# Patient Record
Sex: Male | Born: 1950
Health system: Southern US, Community
[De-identification: ages and names within clinical notes are randomized; demographics above are authoritative.]

## PROBLEM LIST (undated history)

## (undated) DIAGNOSIS — C801 Malignant (primary) neoplasm, unspecified: Secondary | ICD-10-CM

## (undated) DIAGNOSIS — S42009A Fracture of unspecified part of unspecified clavicle, initial encounter for closed fracture: Secondary | ICD-10-CM

## (undated) DIAGNOSIS — K219 Gastro-esophageal reflux disease without esophagitis: Secondary | ICD-10-CM

## (undated) DIAGNOSIS — T4145XA Adverse effect of unspecified anesthetic, initial encounter: Secondary | ICD-10-CM

## (undated) DIAGNOSIS — R011 Cardiac murmur, unspecified: Secondary | ICD-10-CM

## (undated) DIAGNOSIS — Z86718 Personal history of other venous thrombosis and embolism: Secondary | ICD-10-CM

## (undated) DIAGNOSIS — D649 Anemia, unspecified: Secondary | ICD-10-CM

## (undated) DIAGNOSIS — R413 Other amnesia: Secondary | ICD-10-CM

## (undated) DIAGNOSIS — G2581 Restless legs syndrome: Secondary | ICD-10-CM

## (undated) DIAGNOSIS — R7303 Prediabetes: Secondary | ICD-10-CM

## (undated) DIAGNOSIS — N400 Enlarged prostate without lower urinary tract symptoms: Secondary | ICD-10-CM

## (undated) DIAGNOSIS — T8859XA Other complications of anesthesia, initial encounter: Secondary | ICD-10-CM

## (undated) DIAGNOSIS — H9190 Unspecified hearing loss, unspecified ear: Secondary | ICD-10-CM

## (undated) DIAGNOSIS — Z87828 Personal history of other (healed) physical injury and trauma: Secondary | ICD-10-CM

## (undated) DIAGNOSIS — I10 Essential (primary) hypertension: Secondary | ICD-10-CM

## (undated) DIAGNOSIS — M1990 Unspecified osteoarthritis, unspecified site: Secondary | ICD-10-CM

## (undated) HISTORY — PX: COLONOSCOPY: SHX174

## (undated) HISTORY — PX: TONSILLECTOMY: SUR1361

## (undated) HISTORY — PX: OTHER SURGICAL HISTORY: SHX169

## (undated) HISTORY — PX: PROSTATE BIOPSY: SHX241

---

## 1986-06-18 DIAGNOSIS — Z87828 Personal history of other (healed) physical injury and trauma: Secondary | ICD-10-CM

## 1986-06-18 DIAGNOSIS — S42009A Fracture of unspecified part of unspecified clavicle, initial encounter for closed fracture: Secondary | ICD-10-CM

## 1986-06-18 HISTORY — DX: Fracture of unspecified part of unspecified clavicle, initial encounter for closed fracture: S42.009A

## 1986-06-18 HISTORY — DX: Personal history of other (healed) physical injury and trauma: Z87.828

## 2002-10-03 ENCOUNTER — Ambulatory Visit (HOSPITAL_COMMUNITY): Admission: RE | Admit: 2002-10-03 | Discharge: 2002-10-03 | Payer: Self-pay | Admitting: Gastroenterology

## 2013-10-26 ENCOUNTER — Other Ambulatory Visit (HOSPITAL_COMMUNITY): Payer: Self-pay | Admitting: Orthopaedic Surgery

## 2013-11-08 ENCOUNTER — Encounter (HOSPITAL_COMMUNITY): Payer: Self-pay | Admitting: Pharmacy Technician

## 2013-11-14 ENCOUNTER — Encounter (HOSPITAL_COMMUNITY)
Admission: RE | Admit: 2013-11-14 | Discharge: 2013-11-14 | Disposition: A | Discharge status: Home / Self Care | Payer: Managed Care, Other (non HMO) | Source: Ambulatory Visit | Attending: Orthopaedic Surgery | Admitting: Orthopaedic Surgery

## 2013-11-14 ENCOUNTER — Encounter (HOSPITAL_COMMUNITY): Payer: Self-pay

## 2013-11-14 ENCOUNTER — Encounter (INDEPENDENT_AMBULATORY_CARE_PROVIDER_SITE_OTHER): Payer: Self-pay

## 2013-11-14 DIAGNOSIS — Z01818 Encounter for other preprocedural examination: Secondary | ICD-10-CM | POA: Insufficient documentation

## 2013-11-14 DIAGNOSIS — Z01812 Encounter for preprocedural laboratory examination: Secondary | ICD-10-CM | POA: Insufficient documentation

## 2013-11-14 HISTORY — DX: Benign prostatic hyperplasia without lower urinary tract symptoms: N40.0

## 2013-11-14 HISTORY — DX: Cardiac murmur, unspecified: R01.1

## 2013-11-14 HISTORY — DX: Unspecified osteoarthritis, unspecified site: M19.90

## 2013-11-14 HISTORY — DX: Gastro-esophageal reflux disease without esophagitis: K21.9

## 2013-11-14 LAB — URINALYSIS, ROUTINE W REFLEX MICROSCOPIC
Bilirubin Urine: NEGATIVE
Glucose, UA: NEGATIVE mg/dL
Hgb urine dipstick: NEGATIVE
Ketones, ur: NEGATIVE mg/dL
Leukocytes, UA: NEGATIVE
Nitrite: NEGATIVE
PH: 5.5 (ref 5.0–8.0)
Protein, ur: NEGATIVE mg/dL
SPECIFIC GRAVITY, URINE: 1.02 (ref 1.005–1.030)
Urobilinogen, UA: 0.2 mg/dL (ref 0.0–1.0)

## 2013-11-14 LAB — BASIC METABOLIC PANEL
ANION GAP: 11 (ref 5–15)
BUN: 17 mg/dL (ref 6–23)
CO2: 27 mEq/L (ref 19–32)
Calcium: 9 mg/dL (ref 8.4–10.5)
Chloride: 102 mEq/L (ref 96–112)
Creatinine, Ser: 0.82 mg/dL (ref 0.50–1.35)
Glucose, Bld: 130 mg/dL — ABNORMAL HIGH (ref 70–99)
POTASSIUM: 3.5 meq/L — AB (ref 3.7–5.3)
SODIUM: 140 meq/L (ref 137–147)

## 2013-11-14 LAB — SURGICAL PCR SCREEN
MRSA, PCR: NEGATIVE
Staphylococcus aureus: NEGATIVE

## 2013-11-14 LAB — APTT: APTT: 30 s (ref 24–37)

## 2013-11-14 LAB — CBC
HCT: 38.9 % — ABNORMAL LOW (ref 39.0–52.0)
Hemoglobin: 13 g/dL (ref 13.0–17.0)
MCH: 27.3 pg (ref 26.0–34.0)
MCHC: 33.4 g/dL (ref 30.0–36.0)
MCV: 81.7 fL (ref 78.0–100.0)
PLATELETS: 275 10*3/uL (ref 150–400)
RBC: 4.76 MIL/uL (ref 4.22–5.81)
RDW: 15.8 % — ABNORMAL HIGH (ref 11.5–15.5)
WBC: 4.5 10*3/uL (ref 4.0–10.5)

## 2013-11-14 LAB — PROTIME-INR
INR: 0.98 (ref 0.00–1.49)
PROTHROMBIN TIME: 13 s (ref 11.6–15.2)

## 2013-11-14 NOTE — Pre-Procedure Instructions (Signed)
EKG AND CXR WERE NOT DONE PREOP - NOT NEEDED PER ANESTHESIOLOGIST'S GUIDELINES.

## 2013-11-14 NOTE — Patient Instructions (Signed)
YOUR SURGERY IS SCHEDULED AT [facility]  ON:  Friday  8/7  REPORT TO  SHORT STAY CENTER AT:  8:15 AM   PLEASE COME IN THE [place] ENTRANCE AND FOLLOW SIGNS TO SHORT STAY CENTER.  DO NOT EAT OR DRINK ANYTHING AFTER MIDNIGHT THE NIGHT BEFORE YOUR SURGERY.  YOU MAY BRUSH YOUR TEETH, RINSE OUT YOUR MOUTH--BUT NO WATER, NO FOOD, NO CHEWING GUM, NO MINTS, NO CANDIES, NO CHEWING TOBACCO.  PLEASE TAKE THE FOLLOWING MEDICATIONS THE AM OF YOUR SURGERY WITH A FEW SIPS OF WATER:  PROZAC  I DO NOT BRING VALUABLES, MONEY, CREDIT CARDS.  DO NOT WEAR JEWELRY, MAKE-UP, NAIL POLISH AND NO METAL PINS OR CLIPS IN YOUR HAIR. CONTACT LENS, DENTURES / PARTIALS, GLASSES SHOULD NOT BE WORN TO SURGERY AND IN MOST CASES-HEARING AIDS WILL NEED TO BE REMOVED.  BRING YOUR GLASSES CASE, ANY EQUIPMENT NEEDED FOR YOUR CONTACT LENS. FOR PATIENTS ADMITTED TO THE HOSPITAL--CHECK OUT TIME THE DAY OF DISCHARGE IS 11:00 AM.  ALL INPATIENT ROOMS ARE PRIVATE - WITH BATHROOM, TELEPHONE, TELEVISION AND WIFI INTERNET.                                                    PLEASE READ OVER ANY  FACT SHEETS THAT YOU WERE GIVEN: MRSA INFORMATION, BLOOD TRANSFUSION INFORMATION, INCENTIVE SPIROMETER INFORMATION.  PLEASE BE AWARE THAT YOU MAY NEED ADDITIONAL BLOOD DRAWN DAY OF YOUR SURGERY  _______________________________________________________________________   [facility] - Preparing for Surgery Before surgery, you can play an important role.  Because skin is not sterile, your skin needs to be as free of germs as possible.  You can reduce the number of germs on your skin by washing with CHG (chlorahexidine gluconate) soap before surgery.  CHG is an antiseptic cleaner which kills germs and bonds with the skin to continue killing germs even after washing. Please DO NOT use if you have an allergy to CHG or antibacterial soaps.  If your skin becomes reddened/irritated stop using the CHG and inform your nurse when  you arrive at Short Stay. Do not shave (including legs and underarms) for at least 48 hours prior to the first CHG shower.  You may shave your face/neck. Please follow these instructions carefully:  1.  Shower with CHG Soap the night before surgery and the  morning of Surgery.  2.  If you choose to wash your hair, wash your hair first as usual with your  normal  shampoo.  3.  After you shampoo, rinse your hair and body thoroughly to remove the  shampoo.                           4.  Use CHG as you would any other liquid soap.  You can apply chg directly  to the skin and wash                       Gently with a scrungie or clean washcloth.  5.  Apply the CHG Soap to your body ONLY FROM THE NECK DOWN.   Do not use on face/ open                           Wound or open  sores. Avoid contact with eyes, ears mouth and genitals (private parts).                       Wash face,  Genitals (private parts) with your normal soap.             6.  Wash thoroughly, paying special attention to the area where your surgery  will be performed.  7.  Thoroughly rinse your body with warm water from the neck down.  8.  DO NOT shower/wash with your normal soap after using and rinsing off  the CHG Soap.                9.  Pat yourself dry with a clean towel.            10.  Wear clean pajamas.            11.  Place clean sheets on your bed the night of your first shower and do not  sleep with pets. Day of Surgery : Do not apply any lotions/deodorants the morning of surgery.  Please wear clean clothes to the hospital/surgery center.  FAILURE TO FOLLOW THESE INSTRUCTIONS MAY RESULT IN THE CANCELLATION OF YOUR SURGERY PATIENT SIGNATURE_________________________________  NURSE SIGNATURE__________________________________  ________________________________________________________________________  WHAT IS A BLOOD TRANSFUSION? Blood Transfusion Information  A transfusion is the replacement of blood or some of its parts.  Blood is made up of multiple cells which provide different functions.  Red blood cells carry oxygen and are used for blood loss replacement.  White blood cells fight against infection.  Platelets control bleeding.  Plasma helps clot blood.  Other blood products are available for specialized needs, such as hemophilia or other clotting disorders. BEFORE THE TRANSFUSION  Who gives blood for transfusions?   Healthy volunteers who are fully evaluated to make sure their blood is safe. This is blood bank blood. Transfusion therapy is the safest it has ever been in the practice of medicine. Before blood is taken from a donor, a complete history is taken to make sure that person has no history of diseases nor engages in risky social behavior (examples are intravenous drug use or sexual activity with multiple partners). The donor's travel history is screened to minimize risk of transmitting infections, such as malaria. The donated blood is tested for signs of infectious diseases, such as HIV and hepatitis. The blood is then tested to be sure it is compatible with you in order to minimize the chance of a transfusion reaction. If you or a relative donates blood, this is often done in anticipation of surgery and is not appropriate for emergency situations. It takes many days to process the donated blood. RISKS AND COMPLICATIONS Although transfusion therapy is very safe and saves many lives, the main dangers of transfusion include:   Getting an infectious disease.  Developing a transfusion reaction. This is an allergic reaction to something in the blood you were given. Every precaution is taken to prevent this. The decision to have a blood transfusion has been considered carefully by your caregiver before blood is given. Blood is not given unless the benefits outweigh the risks. AFTER THE TRANSFUSION  Right after receiving a blood transfusion, you will usually feel much better and more energetic. This is  especially true if your red blood cells have gotten low (anemic). The transfusion raises the level of the red blood cells which carry oxygen, and this usually causes an energy increase.  The nurse administering  the transfusion will monitor you carefully for complications. HOME CARE INSTRUCTIONS  No special instructions are needed after a transfusion. You may find your energy is better. Speak with your caregiver about any limitations on activity for underlying diseases you may have. SEEK MEDICAL CARE IF:   Your condition is not improving after your transfusion.  You develop redness or irritation at the intravenous (IV) site. SEEK IMMEDIATE MEDICAL CARE IF:  Any of the following symptoms occur over the next 12 hours:  Shaking chills.  You have a temperature by mouth above 102 F (38.9 C), not controlled by medicine.  Chest, back, or muscle pain.  People around you feel you are not acting correctly or are confused.  Shortness of breath or difficulty breathing.  Dizziness and fainting.  You get a rash or develop hives.  You have a decrease in urine output.  Your urine turns a dark color or changes to pink, red, or brown. Any of the following symptoms occur over the next 10 days:  You have a temperature by mouth above 102 F (38.9 C), not controlled by medicine.  Shortness of breath.  Weakness after normal activity.  The white part of the eye turns yellow (jaundice).  You have a decrease in the amount of urine or are urinating less often.  Your urine turns a dark color or changes to pink, red, or brown. Document Released: 04/02/2000 Document Revised: 06/28/2011 Document Reviewed: 11/20/2007 [facility] Patient Information 2014 [place], [place].  _______________________________________________________________________

## 2013-11-23 ENCOUNTER — Inpatient Hospital Stay (HOSPITAL_COMMUNITY): Payer: Managed Care, Other (non HMO)

## 2013-11-23 ENCOUNTER — Inpatient Hospital Stay (HOSPITAL_COMMUNITY)
Admission: RE | Admit: 2013-11-23 | Discharge: 2013-11-27 | DRG: 462 | Disposition: A | Discharge status: Rehabilitation Facility | Payer: Managed Care, Other (non HMO) | Source: Ambulatory Visit | Attending: Orthopaedic Surgery | Admitting: Orthopaedic Surgery

## 2013-11-23 ENCOUNTER — Encounter (HOSPITAL_COMMUNITY): Payer: Self-pay | Admitting: *Deleted

## 2013-11-23 ENCOUNTER — Encounter (HOSPITAL_COMMUNITY)
Admission: RE | Disposition: A | Discharge status: Rehabilitation Facility | Payer: Self-pay | Source: Ambulatory Visit | Attending: Orthopaedic Surgery

## 2013-11-23 ENCOUNTER — Encounter (HOSPITAL_COMMUNITY): Payer: Managed Care, Other (non HMO) | Admitting: Anesthesiology

## 2013-11-23 ENCOUNTER — Inpatient Hospital Stay (HOSPITAL_COMMUNITY): Payer: Managed Care, Other (non HMO) | Admitting: Anesthesiology

## 2013-11-23 DIAGNOSIS — R030 Elevated blood-pressure reading, without diagnosis of hypertension: Secondary | ICD-10-CM | POA: Diagnosis present

## 2013-11-23 DIAGNOSIS — M25469 Effusion, unspecified knee: Secondary | ICD-10-CM | POA: Diagnosis present

## 2013-11-23 DIAGNOSIS — M170 Bilateral primary osteoarthritis of knee: Secondary | ICD-10-CM

## 2013-11-23 DIAGNOSIS — D62 Acute posthemorrhagic anemia: Secondary | ICD-10-CM | POA: Diagnosis not present

## 2013-11-23 DIAGNOSIS — M898X9 Other specified disorders of bone, unspecified site: Secondary | ICD-10-CM | POA: Diagnosis present

## 2013-11-23 DIAGNOSIS — Z87891 Personal history of nicotine dependence: Secondary | ICD-10-CM

## 2013-11-23 DIAGNOSIS — K219 Gastro-esophageal reflux disease without esophagitis: Secondary | ICD-10-CM | POA: Diagnosis present

## 2013-11-23 DIAGNOSIS — M1710 Unilateral primary osteoarthritis, unspecified knee: Principal | ICD-10-CM | POA: Diagnosis present

## 2013-11-23 DIAGNOSIS — N400 Enlarged prostate without lower urinary tract symptoms: Secondary | ICD-10-CM | POA: Diagnosis present

## 2013-11-23 DIAGNOSIS — Z96653 Presence of artificial knee joint, bilateral: Secondary | ICD-10-CM

## 2013-11-23 DIAGNOSIS — E119 Type 2 diabetes mellitus without complications: Secondary | ICD-10-CM | POA: Diagnosis present

## 2013-11-23 DIAGNOSIS — M21869 Other specified acquired deformities of unspecified lower leg: Secondary | ICD-10-CM | POA: Diagnosis present

## 2013-11-23 HISTORY — PX: TOTAL KNEE ARTHROPLASTY: SHX125

## 2013-11-23 LAB — TYPE AND SCREEN
ABO/RH(D): O POS
Antibody Screen: NEGATIVE

## 2013-11-23 LAB — ABO/RH: ABO/RH(D): O POS

## 2013-11-23 SURGERY — ARTHROPLASTY, KNEE, BILATERAL, TOTAL
Anesthesia: Epidural | Site: Knee | Laterality: Bilateral

## 2013-11-23 MED ORDER — HYDROMORPHONE HCL PF 1 MG/ML IJ SOLN
1.0000 mg | INTRAMUSCULAR | Status: DC | PRN
  Administered 2013-11-24: 1 mg via INTRAVENOUS
  Filled 2013-11-23: qty 1

## 2013-11-23 MED ORDER — DOCUSATE SODIUM 100 MG PO CAPS
100.0000 mg | ORAL_CAPSULE | Freq: Two times a day (BID) | ORAL | Status: DC
  Administered 2013-11-23 – 2013-11-27 (×8): 100 mg via ORAL

## 2013-11-23 MED ORDER — PROPOFOL 10 MG/ML IV BOLUS
INTRAVENOUS | Status: AC
  Filled 2013-11-23: qty 20

## 2013-11-23 MED ORDER — BUPIVACAINE HCL (PF) 0.5 % IJ SOLN
INTRAMUSCULAR | Status: DC | PRN
  Administered 2013-11-23: 4 mL via EPIDURAL
  Administered 2013-11-23 (×2): 5 mL via EPIDURAL

## 2013-11-23 MED ORDER — POLYETHYLENE GLYCOL 3350 17 G PO PACK
17.0000 g | PACK | Freq: Every day | ORAL | Status: DC | PRN
  Administered 2013-11-24 – 2013-11-26 (×3): 17 g via ORAL

## 2013-11-23 MED ORDER — ONDANSETRON HCL 4 MG PO TABS: 4.0000 mg | ORAL_TABLET | Freq: Four times a day (QID) | ORAL | Status: DC | PRN

## 2013-11-23 MED ORDER — ALUM & MAG HYDROXIDE-SIMETH 200-200-20 MG/5ML PO SUSP
30.0000 mL | ORAL | Status: DC | PRN
  Administered 2013-11-27: 30 mL via ORAL
  Filled 2013-11-23: qty 30

## 2013-11-23 MED ORDER — LIDOCAINE-EPINEPHRINE 2 %-1:100000 IJ SOLN
INTRAMUSCULAR | Status: DC | PRN
  Administered 2013-11-23: 5 mL
  Administered 2013-11-23: 4 mL via INTRADERMAL
  Administered 2013-11-23: 6 mL via INTRADERMAL
  Administered 2013-11-23: 5 mL via INTRADERMAL

## 2013-11-23 MED ORDER — OXYCODONE HCL 5 MG PO TABS
5.0000 mg | ORAL_TABLET | ORAL | Status: DC | PRN
  Administered 2013-11-23 – 2013-11-24 (×2): 10 mg via ORAL
  Administered 2013-11-24 – 2013-11-25 (×5): 15 mg via ORAL
  Administered 2013-11-25: 10 mg via ORAL
  Administered 2013-11-25 (×2): 15 mg via ORAL
  Administered 2013-11-25 – 2013-11-26 (×6): 10 mg via ORAL
  Administered 2013-11-27: 5 mg via ORAL
  Filled 2013-11-23: qty 3
  Filled 2013-11-23: qty 2
  Filled 2013-11-23 (×2): qty 3
  Filled 2013-11-23 (×2): qty 2
  Filled 2013-11-23: qty 3
  Filled 2013-11-23 (×2): qty 2
  Filled 2013-11-23: qty 3
  Filled 2013-11-23 (×2): qty 2
  Filled 2013-11-23: qty 1
  Filled 2013-11-23: qty 3
  Filled 2013-11-23 (×2): qty 2
  Filled 2013-11-23: qty 3
  Filled 2013-11-23 (×2): qty 1

## 2013-11-23 MED ORDER — FENTANYL CITRATE 0.05 MG/ML IJ SOLN
INTRAMUSCULAR | Status: AC
  Filled 2013-11-23: qty 2

## 2013-11-23 MED ORDER — ONDANSETRON HCL 4 MG/2ML IJ SOLN: 4.0000 mg | Freq: Four times a day (QID) | INTRAMUSCULAR | Status: DC | PRN

## 2013-11-23 MED ORDER — DIPHENHYDRAMINE HCL 12.5 MG/5ML PO ELIX: 12.5000 mg | ORAL_SOLUTION | ORAL | Status: DC | PRN

## 2013-11-23 MED ORDER — ACETAMINOPHEN 650 MG RE SUPP: 650.0000 mg | Freq: Four times a day (QID) | RECTAL | Status: DC | PRN

## 2013-11-23 MED ORDER — METOCLOPRAMIDE HCL 10 MG PO TABS: 5.0000 mg | ORAL_TABLET | Freq: Three times a day (TID) | ORAL | Status: DC | PRN

## 2013-11-23 MED ORDER — ROPIVACAINE HCL 2 MG/ML IJ SOLN
10.0000 mL/h | INTRAMUSCULAR | Status: DC
  Administered 2013-11-23: 10 mL/h via EPIDURAL
  Filled 2013-11-23 (×3): qty 200

## 2013-11-23 MED ORDER — CEFAZOLIN SODIUM 1-5 GM-% IV SOLN
1.0000 g | Freq: Four times a day (QID) | INTRAVENOUS | Status: AC
  Administered 2013-11-23 (×2): 1 g via INTRAVENOUS
  Filled 2013-11-23 (×2): qty 50

## 2013-11-23 MED ORDER — LACTATED RINGERS IV SOLN
INTRAVENOUS | Status: DC
  Administered 2013-11-23 (×2): via INTRAVENOUS

## 2013-11-23 MED ORDER — FENTANYL CITRATE 0.05 MG/ML IJ SOLN
50.0000 ug | INTRAMUSCULAR | Status: AC | PRN
  Administered 2013-11-23 (×2): 50 ug via INTRAVENOUS

## 2013-11-23 MED ORDER — PROMETHAZINE HCL 25 MG/ML IJ SOLN
6.2500 mg | INTRAMUSCULAR | Status: DC | PRN
Start: 2013-11-23 — End: 2013-11-23

## 2013-11-23 MED ORDER — DEXTROSE 5 % IV SOLN
500.0000 mg | Freq: Four times a day (QID) | INTRAVENOUS | Status: DC | PRN
  Filled 2013-11-23: qty 5

## 2013-11-23 MED ORDER — OXYCODONE HCL 5 MG/5ML PO SOLN
5.0000 mg | Freq: Once | ORAL | Status: DC | PRN
  Filled 2013-11-23: qty 5

## 2013-11-23 MED ORDER — OXYCODONE HCL 5 MG PO TABS: 5.0000 mg | ORAL_TABLET | Freq: Once | ORAL | Status: DC | PRN

## 2013-11-23 MED ORDER — SODIUM CHLORIDE 0.9 % IV SOLN
INTRAVENOUS | Status: DC
  Administered 2013-11-23 – 2013-11-24 (×3): via INTRAVENOUS

## 2013-11-23 MED ORDER — METHOCARBAMOL 500 MG PO TABS
500.0000 mg | ORAL_TABLET | Freq: Four times a day (QID) | ORAL | Status: DC | PRN
  Administered 2013-11-24 – 2013-11-27 (×9): 500 mg via ORAL
  Filled 2013-11-23 (×9): qty 1

## 2013-11-23 MED ORDER — CEFAZOLIN SODIUM-DEXTROSE 2-3 GM-% IV SOLR
INTRAVENOUS | Status: AC
  Filled 2013-11-23: qty 50

## 2013-11-23 MED ORDER — FENTANYL CITRATE 0.05 MG/ML IJ SOLN
INTRAMUSCULAR | Status: DC | PRN
  Administered 2013-11-23: 100 ug via EPIDURAL

## 2013-11-23 MED ORDER — PROPOFOL INFUSION 10 MG/ML OPTIME
INTRAVENOUS | Status: DC | PRN
  Administered 2013-11-23: 120 ug/kg/min via INTRAVENOUS

## 2013-11-23 MED ORDER — LIDOCAINE-EPINEPHRINE (PF) 2 %-1:200000 IJ SOLN
INTRAMUSCULAR | Status: AC
  Filled 2013-11-23: qty 20

## 2013-11-23 MED ORDER — SODIUM CHLORIDE 0.9 % IV SOLN: INTRAVENOUS | Status: DC

## 2013-11-23 MED ORDER — BUPIVACAINE HCL (PF) 0.5 % IJ SOLN
INTRAMUSCULAR | Status: AC
  Filled 2013-11-23: qty 30

## 2013-11-23 MED ORDER — DEXAMETHASONE SODIUM PHOSPHATE 10 MG/ML IJ SOLN
INTRAMUSCULAR | Status: DC | PRN
  Administered 2013-11-23: 10 mg via INTRAVENOUS

## 2013-11-23 MED ORDER — INSULIN ASPART 100 UNIT/ML ~~LOC~~ SOLN: 0.0000 [IU] | Freq: Three times a day (TID) | SUBCUTANEOUS | Status: DC

## 2013-11-23 MED ORDER — ACETAMINOPHEN 325 MG PO TABS
650.0000 mg | ORAL_TABLET | Freq: Four times a day (QID) | ORAL | Status: DC | PRN
  Administered 2013-11-24 – 2013-11-25 (×2): 650 mg via ORAL
  Filled 2013-11-23 (×2): qty 2

## 2013-11-23 MED ORDER — 0.9 % SODIUM CHLORIDE (POUR BTL) OPTIME
TOPICAL | Status: DC | PRN
  Administered 2013-11-23: 1000 mL

## 2013-11-23 MED ORDER — ONDANSETRON HCL 4 MG/2ML IJ SOLN
INTRAMUSCULAR | Status: DC | PRN
  Administered 2013-11-23: 4 mg via INTRAVENOUS

## 2013-11-23 MED ORDER — RIVAROXABAN 10 MG PO TABS: 10.0000 mg | ORAL_TABLET | Freq: Every day | ORAL | Status: DC

## 2013-11-23 MED ORDER — MEPERIDINE HCL 50 MG/ML IJ SOLN: 6.2500 mg | INTRAMUSCULAR | Status: DC | PRN

## 2013-11-23 MED ORDER — MENTHOL 3 MG MT LOZG: 1.0000 | LOZENGE | OROMUCOSAL | Status: DC | PRN

## 2013-11-23 MED ORDER — ZOLPIDEM TARTRATE 5 MG PO TABS
5.0000 mg | ORAL_TABLET | Freq: Every evening | ORAL | Status: DC | PRN
  Administered 2013-11-26: 5 mg via ORAL
  Filled 2013-11-23: qty 1

## 2013-11-23 MED ORDER — FERROUS SULFATE 325 (65 FE) MG PO TABS
325.0000 mg | ORAL_TABLET | Freq: Three times a day (TID) | ORAL | Status: DC
  Administered 2013-11-24 – 2013-11-25 (×6): 325 mg via ORAL
  Filled 2013-11-23 (×14): qty 1

## 2013-11-23 MED ORDER — ONDANSETRON HCL 4 MG/2ML IJ SOLN
INTRAMUSCULAR | Status: AC
  Filled 2013-11-23: qty 2

## 2013-11-23 MED ORDER — SODIUM CHLORIDE 0.9 % IV SOLN
1000.0000 mg | INTRAVENOUS | Status: AC
  Administered 2013-11-23: 1000 mg via INTRAVENOUS
  Filled 2013-11-23: qty 10

## 2013-11-23 MED ORDER — MIDAZOLAM HCL 2 MG/2ML IJ SOLN
1.0000 mg | INTRAMUSCULAR | Status: DC | PRN
  Administered 2013-11-23: 2 mg via INTRAVENOUS

## 2013-11-23 MED ORDER — METOCLOPRAMIDE HCL 5 MG/ML IJ SOLN: 5.0000 mg | Freq: Three times a day (TID) | INTRAMUSCULAR | Status: DC | PRN

## 2013-11-23 MED ORDER — HYDROMORPHONE HCL PF 1 MG/ML IJ SOLN
0.2500 mg | INTRAMUSCULAR | Status: DC | PRN
Start: 2013-11-23 — End: 2013-11-23

## 2013-11-23 MED ORDER — PHENOL 1.4 % MT LIQD
1.0000 | OROMUCOSAL | Status: DC | PRN
Start: 2013-11-23 — End: 2013-11-27

## 2013-11-23 MED ORDER — MIDAZOLAM HCL 2 MG/2ML IJ SOLN
INTRAMUSCULAR | Status: AC
  Filled 2013-11-23: qty 2

## 2013-11-23 MED ORDER — MIDAZOLAM HCL 5 MG/5ML IJ SOLN
INTRAMUSCULAR | Status: DC | PRN
  Administered 2013-11-23: 2 mg via INTRAVENOUS

## 2013-11-23 MED ORDER — ROPIVACAINE HCL 2 MG/ML IJ SOLN
8.0000 mL/h | INTRAMUSCULAR | Status: DC
  Filled 2013-11-23 (×3): qty 200

## 2013-11-23 MED ORDER — SODIUM CHLORIDE 0.9 % IR SOLN
Status: DC | PRN
  Administered 2013-11-23: 4000 mL

## 2013-11-23 MED ORDER — CEFAZOLIN SODIUM-DEXTROSE 2-3 GM-% IV SOLR
2.0000 g | INTRAVENOUS | Status: AC
  Administered 2013-11-23: 2 g via INTRAVENOUS

## 2013-11-23 SURGICAL SUPPLY — 58 items
BAG SPEC THK2 15X12 ZIP CLS (MISCELLANEOUS) ×2
BAG ZIPLOCK 12X15 (MISCELLANEOUS) ×5 IMPLANT
BANDAGE ELASTIC 6 VELCRO ST LF (GAUZE/BANDAGES/DRESSINGS) ×6 IMPLANT
BANDAGE ESMARK 6X9 LF (GAUZE/BANDAGES/DRESSINGS) ×1 IMPLANT
BLADE SAG 18X100X1.27 (BLADE) ×6 IMPLANT
BLADE SURG SZ10 CARB STEEL (BLADE) ×6 IMPLANT
BNDG CMPR 9X6 STRL LF SNTH (GAUZE/BANDAGES/DRESSINGS) ×1
BNDG COHESIVE 6X5 TAN STRL LF (GAUZE/BANDAGES/DRESSINGS) ×3 IMPLANT
BNDG ESMARK 6X9 LF (GAUZE/BANDAGES/DRESSINGS) ×3
BOWL SMART MIX CTS (DISPOSABLE) ×6 IMPLANT
CEMENT BONE 1-PACK (Cement) ×12 IMPLANT
CUFF TOURN SGL QUICK 34 (TOURNIQUET CUFF) ×6
CUFF TRNQT CYL 34X4X40X1 (TOURNIQUET CUFF) ×2 IMPLANT
DRAPE EXTREMITY BILATERAL (DRAPE) ×3 IMPLANT
DRAPE INCISE IOBAN 66X45 STRL (DRAPES) ×3 IMPLANT
DRAPE POUCH INSTRU U-SHP 10X18 (DRAPES) ×3 IMPLANT
DRAPE U-SHAPE 47X51 STRL (DRAPES) ×8 IMPLANT
DRSG PAD ABDOMINAL 8X10 ST (GAUZE/BANDAGES/DRESSINGS) ×4 IMPLANT
DURAPREP 26ML APPLICATOR (WOUND CARE) ×6 IMPLANT
ELECT REM PT RETURN 9FT ADLT (ELECTROSURGICAL) ×3
ELECTRODE REM PT RTRN 9FT ADLT (ELECTROSURGICAL) ×1 IMPLANT
EVACUATOR 1/8 PVC DRAIN (DRAIN) ×6 IMPLANT
FACESHIELD WRAPAROUND (MASK) ×18 IMPLANT
FACESHIELD WRAPAROUND OR TEAM (MASK) ×5 IMPLANT
GAUZE SPONGE 2X2 8PLY STRL LF (GAUZE/BANDAGES/DRESSINGS) ×2 IMPLANT
GAUZE SPONGE 4X4 12PLY STRL (GAUZE/BANDAGES/DRESSINGS) ×4 IMPLANT
GAUZE XEROFORM 5X9 LF (GAUZE/BANDAGES/DRESSINGS) ×4 IMPLANT
GLOVE BIO SURGEON STRL SZ7.5 (GLOVE) ×6 IMPLANT
GLOVE BIOGEL PI IND STRL 8 (GLOVE) ×2 IMPLANT
GLOVE BIOGEL PI INDICATOR 8 (GLOVE) ×4
GLOVE ECLIPSE 8.0 STRL XLNG CF (GLOVE) ×6 IMPLANT
GOWN STRL REUS W/TWL XL LVL3 (GOWN DISPOSABLE) ×5 IMPLANT
HANDPIECE INTERPULSE COAX TIP (DISPOSABLE) ×3
IMMOBILIZER KNEE 20 (SOFTGOODS) ×6
IMMOBILIZER KNEE 20 THIGH 36 (SOFTGOODS) ×2 IMPLANT
KIT BASIN OR (CUSTOM PROCEDURE TRAY) ×3 IMPLANT
KNEE/VIT E POLY LINER LEVEL 1B ×4 IMPLANT
MANIFOLD NEPTUNE II (INSTRUMENTS) ×3 IMPLANT
PACK TOTAL JOINT (CUSTOM PROCEDURE TRAY) ×3 IMPLANT
PADDING CAST COTTON 6X4 STRL (CAST SUPPLIES) ×4 IMPLANT
POSITIONER SURGICAL ARM (MISCELLANEOUS) ×6 IMPLANT
SET HNDPC FAN SPRY TIP SCT (DISPOSABLE) ×1 IMPLANT
SET PAD KNEE POSITIONER (MISCELLANEOUS) ×6 IMPLANT
SPONGE GAUZE 2X2 STER 10/PKG (GAUZE/BANDAGES/DRESSINGS) ×4
SPONGE LAP 18X18 X RAY DECT (DISPOSABLE) ×4 IMPLANT
STAPLER VISISTAT 35W (STAPLE) ×4 IMPLANT
STOCKINETTE 8 INCH (MISCELLANEOUS) ×3 IMPLANT
SUCTION FRAZIER 12FR DISP (SUCTIONS) ×3 IMPLANT
SUT VIC AB 0 CT1 27 (SUTURE) ×12
SUT VIC AB 0 CT1 27XBRD ANTBC (SUTURE) ×6 IMPLANT
SUT VIC AB 1 CT1 27 (SUTURE) ×18
SUT VIC AB 1 CT1 27XBRD ANTBC (SUTURE) ×6 IMPLANT
SUT VIC AB 2-0 CT1 27 (SUTURE) ×15
SUT VIC AB 2-0 CT1 TAPERPNT 27 (SUTURE) ×6 IMPLANT
TOWEL OR 17X26 10 PK STRL BLUE (TOWEL DISPOSABLE) ×4 IMPLANT
TRAY FOLEY CATH 16FRSI W/METER (SET/KITS/TRAYS/PACK) ×2 IMPLANT
WATER STERILE IRR 1500ML POUR (IV SOLUTION) ×3 IMPLANT
WRAP KNEE MAXI GEL POST OP (GAUZE/BANDAGES/DRESSINGS) ×6 IMPLANT

## 2013-11-23 NOTE — Anesthesia Preprocedure Evaluation (Signed)
Anesthesia Evaluation  Patient identified by MRN, date of birth, ID band Patient awake    Reviewed: Allergy & Precautions, H&P , NPO status , Patient's Chart, lab work & pertinent test results  Airway Mallampati: II TM Distance: >3 FB Neck ROM: Full    Dental no notable dental hx.    Pulmonary neg pulmonary ROS, former smoker,  breath sounds clear to auscultation  Pulmonary exam normal       Cardiovascular negative cardio ROS  + Valvular Problems/Murmurs Rhythm:Regular Rate:Normal     Neuro/Psych negative neurological ROS  negative psych ROS   GI/Hepatic Neg liver ROS, GERD-  ,  Endo/Other  negative endocrine ROS  Renal/GU negative Renal ROS     Musculoskeletal negative musculoskeletal ROS (+)   Abdominal   Peds  Hematology negative hematology ROS (+)   Anesthesia Other Findings   Reproductive/Obstetrics                           Anesthesia Physical Anesthesia Plan  ASA: II  Anesthesia Plan: General and Epidural   Post-op Pain Management:    Induction: Intravenous  Airway Management Planned:   Additional Equipment:   Intra-op Plan:   Post-operative Plan: Extubation in OR  Informed Consent: I have reviewed the patients History and Physical, chart, labs and discussed the procedure including the risks, benefits and alternatives for the proposed anesthesia with the patient or authorized representative who has indicated his/her understanding and acceptance.   Dental advisory given  Plan Discussed with: CRNA  Anesthesia Plan Comments:         Anesthesia Quick Evaluation

## 2013-11-23 NOTE — Anesthesia Procedure Notes (Addendum)
Epidural Patient location during procedure: OB  Staffing Anesthesiologist: Nolon Nations R Performed by: anesthesiologist   Preanesthetic Checklist Completed: patient identified, pre-op evaluation, timeout performed, IV checked, risks and benefits discussed and monitors and equipment checked  Epidural Patient position: sitting Prep: ChloraPrep, site prepped and draped and Full sterile gown. Chloraprep x 2 Patient monitoring: heart rate Approach: midline Location: L4-L5 Injection technique: LOR air and LOR saline  Needle:  Needle type: Tuohy  Needle gauge: 17 G Needle length: 9 cm Needle insertion depth: 6 cm Catheter type: closed end flexible Catheter size: 19 Gauge Catheter at skin depth: 12 cm Test dose: negative and 1.5% lidocaine with Epi 1:200 K  Assessment Sensory level: T8 Events: blood not aspirated, injection not painful, no injection resistance, negative IV test and no paresthesia  Additional Notes Reason for block:procedure for pain

## 2013-11-23 NOTE — Brief Op Note (Signed)
11/23/2013  1:30 PM  PATIENT:  Marcelle Smiling  63 y.o. male  PRE-OPERATIVE DIAGNOSIS:  Severe arthritis bilateral knees  POST-OPERATIVE DIAGNOSIS:  Severe arthritis bilateral knees  PROCEDURE:  Procedure(s): TOTAL KNEE BILATERAL (Bilateral)  SURGEON:  Surgeon(s) and Role:    * Mcarthur Rossetti, MD - Primary  PHYSICIAN ASSISTANT: Benita Stabile, PA-C  ANESTHESIA:   epidural  EBL:  Total I/O In: 1000 [I.V.:1000] Out: 600 [Urine:600]  BLOOD ADMINISTERED:none  DRAINS: (medium) Hemovact drain(s) in the both knee joints with  Suction Open   LOCAL MEDICATIONS USED:  NONE  SPECIMEN:  No Specimen  DISPOSITION OF SPECIMEN:  N/A  COUNTS:  YES  TOURNIQUET:   Total Tourniquet Time Documented: Thigh (Left) - 51 minutes Total: Thigh (Left) - 51 minutes  Thigh (Right) - 67 minutes Total: Thigh (Right) - 67 minutes   DICTATION: .Other Dictation: Dictation Number [id number]  PLAN OF CARE: Admit to inpatient   PATIENT DISPOSITION:  PACU - hemodynamically stable.   Delay start of Pharmacological VTE agent (>24hrs) due to surgical blood loss or risk of bleeding: yes; delay start of Xarelto until tomorrow evening 8/8 due to his epidural

## 2013-11-23 NOTE — Plan of Care (Signed)
Problem: Phase I Progression Outcomes Goal: Dangle or out of bed evening of surgery Outcome: Not Met (add Reason) epidural

## 2013-11-23 NOTE — Progress Notes (Signed)
Anesthesia Epidural Note Atraumatic placement. Pain well controlled in PACU.   Infusion of 0.2% Ropivicaine at 71ml/hr   Anticoagulation plan: Will continue epidural infusion today. Plan to remove catheter tomorrow. PO anticoagulant can be given 6 hours after epidural removal. Please call with concerns.

## 2013-11-23 NOTE — Plan of Care (Signed)
Problem: Consults Goal: Diagnosis- Total Joint Replacement Bilateral knees

## 2013-11-23 NOTE — H&P (Signed)
TOTAL KNEE ADMISSION H&P  Patient is being admitted for bilaterally total knee arthroplasty.  Subjective:  Chief Complaint:bilaterally knee pain.  HPI: Miguel Morris, 63 y.o. male, has a history of pain and functional disability in the bilaterally knee due to arthritis and has failed non-surgical conservative treatments for greater than 12 weeks to includeNSAID's and/or analgesics, corticosteriod injections and activity modification.  Onset of symptoms was gradual, starting 9 years ago with gradually worsening course since that time. The patient noted no past surgery on the bilaterally knee(s).  Patient currently rates pain in the bilaterally knee(s) at 10 out of 10 with activity. Patient has worsening of pain with activity and weight bearing, pain that interferes with activities of daily living, pain with passive range of motion and joint swelling.  Patient has evidence of subchondral sclerosis, periarticular osteophytes and joint space narrowing by imaging studies. There is no active infection.  Patient Active Problem List   Diagnosis Date Noted  . Degenerative arthritis of knee, bilateral 11/23/2013   Past Medical History  Diagnosis Date  . Arthritis     oa and pain both knees  . Heart murmur     STATES BORN WITH HEART MURMUR BUT NO LONGER HAS THE MURMUR  . GERD (gastroesophageal reflux disease)     TUMS EVERY NIGHT  . Prostate enlargement     HX OF ELEVATED PSA - DR. TANNENBAUM IS PT'[S UROLOGIST    Past Surgical History  Procedure Laterality Date  . Broken clavicle 1988    . Tonsillectomy      AS A CHILD    No prescriptions prior to admission   No Known Allergies  History  Substance Use Topics  . Smoking status: Former Research scientist (life sciences)  . Smokeless tobacco: Never Used  . Alcohol Use: Yes     Comment: QUIT SMOKING 33 YRS AGO;  2 TO 4 BEERS A NIGHT    No family history on file.   Review of Systems  Musculoskeletal: Positive for joint pain.  All other systems reviewed and are  negative.   Objective:  Physical Exam  Constitutional: He is oriented to person, place, and time. He appears well-developed and well-nourished.  HENT:  Head: Normocephalic and atraumatic.  Eyes: EOM are normal. Pupils are equal, round, and reactive to light.  Neck: Normal range of motion. Neck supple.  Cardiovascular: Normal rate and regular rhythm.   Respiratory: Effort normal and breath sounds normal.  GI: Soft. Bowel sounds are normal.  Musculoskeletal:       Right knee: He exhibits decreased range of motion, effusion and abnormal alignment. Tenderness found. Medial joint line and lateral joint line tenderness noted.  Neurological: He is alert and oriented to person, place, and time.  Skin: Skin is warm and dry.  Psychiatric: He has a normal mood and affect.    Vital signs in last 24 hours:    Labs:   There is no height or weight on file to calculate BMI.   Imaging Review Plain radiographs demonstrate severe degenerative joint disease of the bilaterally knee(s). The overall alignment ismild varus. The bone quality appears to be good for age and reported activity level.  Assessment/Plan:  End stage arthritis, bilaterally knee   The patient history, physical examination, clinical judgment of the provider and imaging studies are consistent with end stage degenerative joint disease of the bilaterally knee(s) and total knee arthroplasty is deemed medically necessary. The treatment options including medical management, injection therapy arthroscopy and arthroplasty were discussed at length. The  risks and benefits of total knee arthroplasty were presented and reviewed. The risks due to aseptic loosening, infection, stiffness, patella tracking problems, thromboembolic complications and other imponderables were discussed. The patient acknowledged the explanation, agreed to proceed with the plan and consent was signed. Patient is being admitted for inpatient treatment for surgery, pain  control, PT, OT, prophylactic antibiotics, VTE prophylaxis, progressive ambulation and ADL's and discharge planning. The patient is planning to be discharged home with home health services

## 2013-11-23 NOTE — Op Note (Signed)
Miguel Morris, Miguel Morris NO.:  [id number]  MEDICAL RECORD NO.:  [id number]  LOCATION:  [facility]                         FACILITY:  [facility]  PHYSICIAN:  Miguel Morris, M.D.DATE OF BIRTH:  [birth year]  DATE OF PROCEDURE:  11/23/2013 DATE OF DISCHARGE:                              OPERATIVE REPORT   PREOPERATIVE DIAGNOSIS:  Severe osteoarthritis with varus deformities, bilateral knees.  POSTOPERATIVE DIAGNOSIS:  Severe osteoarthritis with varus deformities, bilateral knees.  PROCEDURE:  Bilateral total knee arthroplasties.  IMPLANTS:  Stryker Triathlon knee with 6 femur, size 6 tibial tray, 9 mm fix bearing polyethylene insert, size 40 patella button on both knees.  SURGEON:  Jean Rosenthal MD.  ASSISTANT:  Erskine Emery, PA-C.  ANESTHESIA:  Spinal epidural.  BLOOD LOSS:  Less than 300-400 mL total.  TOURNIQUET TIME:  51 minutes, left knee; and 66 minutes, right knee.  ANTIBIOTICS:  2 g IV Ancef.  COMPLICATIONS:  None.  INDICATIONS:  [name] is a 63 year old gentleman well known to me. Both knees show varus deformities with end-stage arthritis of both knees, both hurting severely.  He is a very healthy individual.  He has some mild diabetes and high blood pressure.  He is very thin and active and requested bilateral total knee arthroplasties.  He understands he has a high mortality risk and morbidity risk with this procedure, and I explained this to him in detail as well as the risks and benefits involved, and he does still wish to proceed with surgery.  PROCEDURE DESCRIPTION:  After both knees were marked, he was brought to the operating room and spinal epidural anesthesia was obtained.  He was then laid in a supine position.  A Foley catheter was placed, and then both knees were prepped simultaneously with DuraPrep and sterile drapes. He elected to start with the left knee first.  Time-out was called identifying the correct patient and  correct bilateral knees starting with the left knee.  We then wrapped out an Esmarch on the left knee, and tourniquet was inflated to 300 mm of pressure.  I made a midline incision and found a large joint effusion once we made a medial parapatellar arthrotomy.  He has significant varus deformity and wear of cartilage throughout his knee, especially on the medial compartment.  We then removed the remaining osteophytes from his knee as well as remnants of the medial and lateral meniscus and ACL and PCL.  With the knee in a flexed position, we used the extramedullary cutting guide for the tibia corrected for neutral slope and set our neutral varus valgus to try to correct his varus deformity.  We made our proximal tibia cut about 9 mm off the high side.  We then used the intramedullary guide to the intercondylar area of the knee for distal femoral cut that we set at 10 mm.  We brought the knee back down in extension after removing soft tissue and debris and releasing more tissue on the lateral side where the 9 mm insert showed him to full extension.  Of note, he had a flexion contracture of about 5 degrees at both knees prior to surgery.  We then went back to the  femur.  With the knee in a flexed position, used the femoral sizing guide, based off the epicondylar axis.  We chose a size 6 femur.  We put the 4 in 1 cutting block for size 6 femur, made our anterior and posterior cuts followed by our chamfer cuts.  We then made our femoral box cut.  Attention was then turned to the tibia.  We trialed a size 6 tibia and made our keel cut off of this.  With the trial 6 tibia and this trial 6 femur, we trialed a 9 mm polyethylene insert, and I was pleased with his range of motion and  stability with this.  We then made our patella button cut to take 11 mm thickness off the patella and replaced with a size 40 patella button.  We then removed all trial components and copiously irrigated the knee on the  left side with normal saline solution.  We then cleaned the knee of debris and was able to cement the real Stryker Triathlon tibial tray size 6, the real size 6 femur.  We placed the real 9 mm fix bearing polyethylene insert and cemented the patellar button.  Once the cement had hardened, we let the tourniquet down.  Hemostasis was obtained with electrocautery, placed a medium Hemovac in the arthrotomy.  The arthrotomy was closed with interrupted #1 Vicryl suture followed by 0 Vicryl in the deep tissue, 2-0 Vicryl in subcutaneous tissue, and staples on the skin.  We then asked Anesthesia if we could proceed with his other knee since he had done well with this knee __________ we could.  We went to his right knee.  I used an Esmarch to wrap it out and tourniquet was inflated to 300 mm of pressure.  We then made an midline incision on the right knee, carried this down to the knee joint.  We performed a medial parapatellar arthrotomy like the left knee, found a large joint effusion.  There was significant varus deformity with osteophytes and debris throughout the knee.  With the knee in a flexed position, we then used the extramedullary cutting guide for the tibia, and we cut 9 mm off the high side correcting for neutral slope and correcting for varus and valgus. I debrided and tried to bring him out of varus.  We then used an intramedullary guide for distal femoral cut at 10 mm and brought the knee back down extension with a 9 mm extension block.  We had regained his extension.  We then put our femoral sizing guide based off the epicondylar axis, and he has a similar size 6 for the right side.  We then put our 4 in 1 cutting block for size 6 femur, made our anterior and posterior cuts and our chamfer cuts.  We then went back to the tibia and trialed a size 6 tibia.  We put our keel cut off of that with the trial 6 tibia and 6 femoral insert.  We placed a 9 mm patella button and it was  stable.  We then removed all trial components and copiously irrigated the knee with normal saline solution.  We mixed cement and then cemented the real Stryker Triathlon size 6 tibia for right and size 6 femur for right knee.  We placed the real 9 mm fix bearing polyethylene insert and cemented the patellar button.  Once the cement had dried, we brought the tourniquet down, and again hemostasis was obtained with electrocautery.  We placed a medium Hemovac  in the arthrotomy, closed the arthrotomy with interrupted #1 Vicryl suture, followed by 0 Vicryl in the deep tissue, 2-0 Vicryl in subcutaneous tissue, and staples on the skin.  Well-padded sterile dressings were placed around both knees with knees placed in knee immobilizers.  He was taken to recovery room in stable condition.  All final counts were correct and no complications noted.  Of note, Erskine Emery, PA-C assisted the entire case and his assistance was crucial throughout all aspects of the case.     Miguel Morris, M.D.     CYB/MEDQ  D:  11/23/2013  T:  11/23/2013  Job:  [id number]

## 2013-11-23 NOTE — Transfer of Care (Signed)
Immediate Anesthesia Transfer of Care Note  Patient: Miguel Morris  Procedure(s) Performed: Procedure(s) (LRB): TOTAL KNEE BILATERAL (Bilateral)  Patient Location: PACU  Anesthesia Type: Epidural  Level of Consciousness: sedated, patient cooperative and responds to stimulation  Airway & Oxygen Therapy: Patient Spontanous Breathing and Patient connected to face mask oxgen  Post-op Assessment: Report given to PACU RN and Post -op Vital signs reviewed and stable  Post vital signs: Reviewed and stable  Complications: No apparent anesthesia complications

## 2013-11-24 LAB — BASIC METABOLIC PANEL
ANION GAP: 9 (ref 5–15)
BUN: 22 mg/dL (ref 6–23)
CALCIUM: 7.8 mg/dL — AB (ref 8.4–10.5)
CO2: 27 mEq/L (ref 19–32)
Chloride: 99 mEq/L (ref 96–112)
Creatinine, Ser: 1.01 mg/dL (ref 0.50–1.35)
GFR calc Af Amer: >90 mL/min (ref >90)
GFR calc non Af Amer: 78 mL/min — ABNORMAL LOW (ref >90)
Glucose, Bld: 172 mg/dL — ABNORMAL HIGH (ref 70–99)
Potassium: 3.7 mEq/L (ref 3.7–5.3)
Sodium: 135 mEq/L — ABNORMAL LOW (ref 137–147)

## 2013-11-24 LAB — CBC
HCT: 29.6 % — ABNORMAL LOW (ref 39.0–52.0)
Hemoglobin: 9.7 g/dL — ABNORMAL LOW (ref 13.0–17.0)
MCH: 27 pg (ref 26.0–34.0)
MCHC: 32.8 g/dL (ref 30.0–36.0)
MCV: 82.5 fL (ref 78.0–100.0)
PLATELETS: 171 10*3/uL (ref 150–400)
RBC: 3.59 MIL/uL — ABNORMAL LOW (ref 4.22–5.81)
RDW: 15.8 % — AB (ref 11.5–15.5)
WBC: 7.3 10*3/uL (ref 4.0–10.5)

## 2013-11-24 MED ORDER — OXYCODONE-ACETAMINOPHEN 5-325 MG PO TABS: 1.0000 | ORAL_TABLET | ORAL | Status: DC | PRN

## 2013-11-24 MED ORDER — RIVAROXABAN 10 MG PO TABS
10.0000 mg | ORAL_TABLET | Freq: Every day | ORAL | Status: DC
  Administered 2013-11-24 – 2013-11-27 (×4): 10 mg via ORAL
  Filled 2013-11-24 (×4): qty 1

## 2013-11-24 NOTE — Discharge Instructions (Signed)
Information on my medicine - XARELTO® (Rivaroxaban) ° °This medication education was reviewed with me or my healthcare representative as part of my discharge preparation.  The pharmacist that spoke with me during my hospital stay was:  Uriel Horkey L, RPH ° °Why was Xarelto® prescribed for you? °Xarelto® was prescribed for you to reduce the risk of blood clots forming after orthopedic surgery. The medical term for these abnormal blood clots is venous thromboembolism (VTE). ° °What do you need to know about xarelto® ? °Take your Xarelto® ONCE DAILY at the same time every day. °You may take it either with or without food. ° °If you have difficulty swallowing the tablet whole, you may crush it and mix in applesauce just prior to taking your dose. ° °Take Xarelto® exactly as prescribed by your doctor and DO NOT stop taking Xarelto® without talking to the doctor who prescribed the medication.  Stopping without other VTE prevention medication to take the place of Xarelto® may increase your risk of developing a clot. ° °After discharge, you should have regular check-up appointments with your healthcare provider that is prescribing your Xarelto®.   ° °What do you do if you miss a dose? °If you miss a dose, take it as soon as you remember on the same day then continue your regularly scheduled once daily regimen the next day. Do not take two doses of Xarelto® on the same day.  ° °Important Safety Information °A possible side effect of Xarelto® is bleeding. You should call your healthcare provider right away if you experience any of the following: °? Bleeding from an injury or your nose that does not stop. °? Unusual colored urine (red or dark brown) or unusual colored stools (red or black). °? Unusual bruising for unknown reasons. °? A serious fall or if you hit your head (even if there is no bleeding). ° °Some medicines may interact with Xarelto® and might increase your risk of bleeding while on Xarelto®. To help avoid this,  consult your healthcare provider or pharmacist prior to using any new prescription or non-prescription medications, including herbals, vitamins, non-steroidal anti-inflammatory drugs (NSAIDs) and supplements. ° °This website has more information on Xarelto®: www.xarelto.com. ° ° ° °

## 2013-11-24 NOTE — Progress Notes (Signed)
Physical Therapy Treatment Patient Details Name: Miguel Morris MRN: [id number] DOB: 1950-05-29 Today's Date: 11/24/2013    History of Present Illness Bil TKR    PT Comments    Pt progressing well and extremely motivated.  Follow Up Recommendations  Home health PT     Equipment Recommendations  None recommended by PT    Recommendations for Other Services OT consult     Precautions / Restrictions Precautions Precautions: Fall;Knee Required Braces or Orthoses: Knee Immobilizer - Right;Knee Immobilizer - Left Knee Immobilizer - Right: Discontinue once straight leg raise with < 10 degree lag Knee Immobilizer - Left: Discontinue once straight leg raise with < 10 degree lag Restrictions Weight Bearing Restrictions: No Other Position/Activity Restrictions: WBAT    Mobility  Bed Mobility Overal bed mobility: Needs Assistance Bed Mobility: Supine to Sit;Sit to Supine     Supine to sit: Mod assist Sit to supine: Mod assist   General bed mobility comments: cues for sequence with physical assist for Bil LEs  Transfers Overall transfer level: Needs assistance Equipment used: Rolling walker (2 wheeled) Transfers: Sit to/from Stand Sit to Stand: From elevated surface;+2 safety/equipment;+2 physical assistance         General transfer comment: cues for LE management and use of UEs to self assist.  Utilized bed to bring pt to near standing  Ambulation/Gait Ambulation/Gait assistance: +2 safety/equipment;+2 physical assistance;Min assist;Mod assist Ambulation Distance (Feet): 12 Feet Assistive device: Rolling walker (2 wheeled) Gait Pattern/deviations: Step-to pattern;Decreased step length - right;Decreased step length - left;Shuffle Gait velocity: decr   General Gait Details: cues for posture, sequence and position from [employer]            Wheelchair Mobility    Modified Rankin (Stroke Patients Only)       Balance                                     Cognition Arousal/Alertness: Awake/alert Behavior During Therapy: WFL for tasks assessed/performed Overall Cognitive Status: Within Functional Limits for tasks assessed                      Exercises      General Comments        Pertinent Vitals/Pain Pain Assessment: 0-10 Pain Score: 2  Pain Location: Bil knees Pain Descriptors / Indicators: Sore Pain Intervention(s): Limited activity within patient's tolerance;Premedicated before session;Ice applied    Home Living                      Prior Function            PT Goals (current goals can now be found in the care plan section) Acute Rehab PT Goals Patient Stated Goal: Home from hospital and back to work ASAP PT Goal Formulation: With patient Time For Goal Achievement: 12/08/13 Potential to Achieve Goals: Good Progress towards PT goals: Progressing toward goals    Frequency  7X/week    PT Plan Current plan remains appropriate    Co-evaluation             End of Session Equipment Utilized During Treatment: Right knee immobilizer;Left knee immobilizer;Gait belt Activity Tolerance: Patient tolerated treatment well Patient left: in bed;with call bell/phone within reach;with family/visitor present     Time: 3491-7915 PT Time Calculation (min): 37 min  Charges:  $Gait Training: 8-22 mins $Therapeutic Activity: 8-22 mins  G Codes:      Miguel Morris 12/10/13, 4:47 PM

## 2013-11-24 NOTE — Evaluation (Signed)
Physical Therapy Evaluation Patient Details Name: Miguel Morris MRN: [id number] DOB: [birth year] Today's Date: 11/24/2013   History of Present Illness  Bil TKR  Clinical Impression  Pt s/p Bil TKR presents with decreased Bil LE strength/ROM and post op pain limiting functional mobility.  Pt is very motivated to return home directly from hospital.  Will review potential for pt goal after epidural is d/c and pt's full capabilities and limitations are more clear    Follow Up Recommendations Home health PT    Equipment Recommendations  None recommended by PT    Recommendations for Other Services OT consult     Precautions / Restrictions Precautions Precautions: Fall;Knee Required Braces or Orthoses: Knee Immobilizer - Right;Knee Immobilizer - Left Knee Immobilizer - Right: Discontinue once straight leg raise with < 10 degree lag Knee Immobilizer - Left: Discontinue once straight leg raise with < 10 degree lag Restrictions Weight Bearing Restrictions: No Other Position/Activity Restrictions: WBAT      Mobility  Bed Mobility Overal bed mobility: +2 for physical assistance;Needs Assistance Bed Mobility: Supine to Sit;Sit to Supine     Supine to sit: Mod assist;+2 for physical assistance;+2 for safety/equipment Sit to supine: Mod assist;+2 for physical assistance;+2 for safety/equipment   General bed mobility comments: cues for sequence with assist for Bil LEs and to control trunk  Transfers Overall transfer level:  (NT 2* dizziness with sitting)                  Ambulation/Gait                Stairs            Wheelchair Mobility    Modified Rankin (Stroke Patients Only)       Balance                                             Pertinent Vitals/Pain Pain Assessment: 0-10 Pain Score: 3  Pain Location: Bil knees - R>L Pain Descriptors / Indicators: Dull;Discomfort Pain Intervention(s): Limited activity within patient's  tolerance;Premedicated before session;Other (comment);Ice applied (Epidural in place)    Home Living Family/patient expects to be discharged to:: Private residence Living Arrangements: Spouse/significant other Available Help at Discharge: Family Type of Home: House Home Access: Stairs to enter Entrance Stairs-Rails: Right Entrance Stairs-Number of Steps: [place]: One level Home Equipment: Environmental consultant - 2 wheels;Cane - single point;Bedside commode      Prior Function Level of Independence: Independent               Hand Dominance   Dominant Hand: Right    Extremity/Trunk Assessment   Upper Extremity Assessment: Overall WFL for tasks assessed           Lower Extremity Assessment: RLE deficits/detail;LLE deficits/detail RLE Deficits / Details: 2-/5 quads with AAROM at knee -10 - 45 LLE Deficits / Details: 2-/5 quads with AAROM at knee -10 - 30  Cervical / Trunk Assessment: Normal  Communication   Communication: No difficulties  Cognition Arousal/Alertness: Awake/alert Behavior During Therapy: WFL for tasks assessed/performed Overall Cognitive Status: Within Functional Limits for tasks assessed                      General Comments      Exercises Total Joint Exercises Ankle Circles/Pumps: AROM;Both;10 reps;Supine Quad Sets: AROM;Both;10 reps;Supine Heel Slides: AAROM;Both;10 reps;Supine Straight  Leg Raises: AAROM;Both;10 reps;Supine      Assessment/Plan    PT Assessment Patient needs continued PT services  PT Diagnosis Difficulty walking   PT Problem List Decreased strength;Decreased range of motion;Decreased activity tolerance;Decreased mobility;Decreased knowledge of use of DME;Pain  PT Treatment Interventions DME instruction;Gait training;Stair training;Functional mobility training;Therapeutic activities;Therapeutic exercise;Patient/family education   PT Goals (Current goals can be found in the Care Plan section) Acute Rehab PT Goals Patient  Stated Goal: Home from hospital and back to work ASAP PT Goal Formulation: With patient Time For Goal Achievement: 12/08/13 Potential to Achieve Goals: Good    Frequency 7X/week   Barriers to discharge        Co-evaluation               End of Session Equipment Utilized During Treatment: Right knee immobilizer;Left knee immobilizer Activity Tolerance: Other (comment) (dizzy with sitting) Patient left: in bed;with call bell/phone within reach Nurse Communication: Mobility status;Other (comment) (dizzy with sitting)         Time: 1110-1143 PT Time Calculation (min): 33 min   Charges:   PT Evaluation $Initial PT Evaluation Tier I: 1 Procedure PT Treatments $Therapeutic Exercise: 8-22 mins $Therapeutic Activity: 8-22 mins   PT G Codes:          Keltie Labell 11/24/2013, 1:06 PM

## 2013-11-24 NOTE — Progress Notes (Signed)
Removed epidural catheter since Xarelto will be started in 6 hours.  Tip intact. Site looks good.  Informed nurses station. Sharie Amorin MD

## 2013-11-24 NOTE — Progress Notes (Signed)
OT Cancellation Note  Patient Details Name: Brandn Mcgath MRN: [id number] DOB: [birth year]   Cancelled Treatment:    Reason Eval/Treat Not Completed: Other (comment).  Pt is s/p bil TKA.  He is not ready for OT yet. Will check back tomorrow.  Shirleymae Hauth 11/24/2013, 11:50 AM Lesle Chris, OTR/L [phone] 11/24/2013

## 2013-11-24 NOTE — Progress Notes (Signed)
Subjective: 1 Day Post-Op Procedure(s) (LRB): TOTAL KNEE BILATERAL (Bilateral) Patient reports pain as minimal due to his epidural.   Objective: Vital signs in last 24 hours: Temp:  [97.4 F (36.3 C)-98.7 F (37.1 C)] 97.7 F (36.5 C) (08/08 0629) Pulse Rate:  [47-81] 50 (08/08 0629) Resp:  [8-23] 15 (08/08 0629) BP: (104-172)/(64-98) 104/68 mmHg (08/08 0629) SpO2:  [90 %-100 %] 100 % (08/08 0629) Weight:  [81.194 kg (179 lb)] 81.194 kg (179 lb) (08/07 1550)  Intake/Output from previous day: 08/07 0701 - 08/08 0700 In: 4370 [P.O.:960; I.V.:3125; IV Piggyback:100] Out: 0923 [Urine:1325; Drains:320] Intake/Output this shift:    No results found for this basename: HGB,  in the last 72 hours No results found for this basename: WBC, RBC, HCT, PLT,  in the last 72 hours No results found for this basename: NA, K, CL, CO2, BUN, CREATININE, GLUCOSE, CALCIUM,  in the last 72 hours No results found for this basename: LABPT, INR,  in the last 72 hours  Intact pulses distally Incision: dressing C/D/I Compartment soft  Assessment/Plan: 1 Day Post-Op Procedure(s) (LRB): TOTAL KNEE BILATERAL (Bilateral) Up with therapy after epidural removed. Xarelto 6 hours after epidural out.  Glorie Dowlen Y 11/24/2013, 9:20 AM

## 2013-11-25 MED ORDER — RIVAROXABAN 10 MG PO TABS: 10.0000 mg | ORAL_TABLET | Freq: Every day | ORAL | Status: DC

## 2013-11-25 MED ORDER — METHOCARBAMOL 500 MG PO TABS: 500.0000 mg | ORAL_TABLET | Freq: Four times a day (QID) | ORAL | Status: DC | PRN

## 2013-11-25 MED ORDER — FERROUS SULFATE 325 (65 FE) MG PO TABS: 325.0000 mg | ORAL_TABLET | Freq: Three times a day (TID) | ORAL | Status: DC

## 2013-11-25 NOTE — Progress Notes (Signed)
Physical Therapy Treatment Patient Details Name: Miguel Morris MRN: [id number] DOB: January 31, 1951 Today's Date: 11/25/2013    History of Present Illness Bil TKR    PT Comments    POD # 2 pm session.  Applied B KI's then assisted out of recliner.  Sit to stand + 2 total asist due to lower surface level.  Amb limited distance to BR then back to bed. Removed B KI's then applied ICE.  Follow Up Recommendations  Home health PT (pt prefers to D/C to home vs SNF/CIR)     Equipment Recommendations       Recommendations for Other Services       Precautions / Restrictions Precautions Precautions: Fall;Knee Precaution Comments: instructed on B KI use for amb Required Braces or Orthoses: Knee Immobilizer - Right;Knee Immobilizer - Left Knee Immobilizer - Right: Discontinue once straight leg raise with < 10 degree lag Knee Immobilizer - Left: Discontinue once straight leg raise with < 10 degree lag Restrictions Weight Bearing Restrictions: No Other Position/Activity Restrictions: WBAT    Mobility  Bed Mobility Overal bed mobility: Needs Assistance Bed Mobility: Sit to Supine       Sit to supine: Mod assist   General bed mobility comments: assist with B LE's up onto bed  Transfers Overall transfer level: Needs assistance Equipment used: Rolling walker (2 wheeled) Transfers: Sit to/from Stand Sit to Stand: +2 physical assistance;+2 safety/equipment;Total assist         General transfer comment: + 2 total asssit from lower level recliner chair and B KI's applied.  Utilized bed sheet around pt's waist to pull body weight up and forward.  Assisted back to bed stand to sit with 50% VC's on proper tech.  Ambulation/Gait Ambulation/Gait assistance: +2 physical assistance;Min assist Ambulation Distance (Feet): 25 Feet (to and from BR only) Assistive device: Rolling walker (2 wheeled) Gait Pattern/deviations: Step-to pattern;Decreased step length - right;Decreased step length -  left Gait velocity: decr   General Gait Details: increased time and 50% VC's on proper walker to gait distance.  Amb distance limited by increased c/o dizziness after amb to BR.  So only amb back to bed vs hallway.     Stairs            Wheelchair Mobility    Modified Rankin (Stroke Patients Only)       Balance                                    Cognition                            Exercises      General Comments        Pertinent Vitals/Pain      Home Living                      Prior Function            PT Goals (current goals can now be found in the care plan section) Progress towards PT goals: Progressing toward goals    Frequency  7X/week    PT Plan      Co-evaluation             End of Session Equipment Utilized During Treatment: Right knee immobilizer;Left knee immobilizer;Gait belt Activity Tolerance: Patient tolerated treatment well Patient left: in bed;with call bell/phone within  reach;with family/visitor present     Time: 1315-1340 PT Time Calculation (min): 25 min  Charges:  $Gait Training: 8-22 mins $Therapeutic Activity: 8-22 mins                    G Codes:      Rica Koyanagi  PTA [facility]  Acute  Rehab Pager      [phone]

## 2013-11-25 NOTE — Progress Notes (Signed)
Physical Therapy Treatment Patient Details Name: Miguel Morris MRN: [id number] DOB: 06-14-1950 Today's Date: 11/25/2013    History of Present Illness Bil TKR    PT Comments    POD # 2 am session.  Pt just got OOB to recliner so Tx session focused on TKR TE's.    Follow Up Recommendations  Home health PT (pt's preference is to go home)     Equipment Recommendations       Recommendations for Other Services       Precautions / Restrictions Precautions Precautions: Fall;Knee Precaution Comments: instructed on B KI use for amb Required Braces or Orthoses: Knee Immobilizer - Right;Knee Immobilizer - Left Knee Immobilizer - Right: Discontinue once straight leg raise with < 10 degree lag Knee Immobilizer - Left: Discontinue once straight leg raise with < 10 degree lag Restrictions Weight Bearing Restrictions: No Other Position/Activity Restrictions: WBAT                               Balance                                    Cognition Arousal/Alertness: Awake/alert Behavior During Therapy: WFL for tasks assessed/performed Overall Cognitive Status: Within Functional Limits for tasks assessed                      Exercises   Total Knee Replacement TE's 10 reps B LE ankle pumps 10 reps towel squeezes 10 reps knee presses 10 reps heel slides  10 reps SAQ's 10 reps SLR's 10 reps ABD Followed by ICE Performed on B LE's    General Comments        Pertinent Vitals/Pain Pain Score: 2  Pain Location: bilateral knees    Home Living Family/patient expects to be discharged to:: Private residence Living Arrangements: Spouse/significant other Available Help at Discharge: Family Type of Home: House Home Access: Stairs to enter Entrance Stairs-Rails: Right Home Layout: One level Home Equipment: Environmental consultant - 2 wheels;Cane - single point;Bedside commode      Prior Function Level of Independence: Independent          PT Goals  (current goals can now be found in the care plan section) Acute Rehab PT Goals Patient Stated Goal: Home from hospital and back to work ASAP Progress towards PT goals: Progressing toward goals    Frequency  7X/week    PT Plan      Co-evaluation             End of Session           Time: 6269-4854 PT Time Calculation (min): 29 min  Charges:  $Therapeutic Exercise: 23-37 mins                    G Codes:      Rica Koyanagi  PTA [facility]  Acute  Rehab Pager      [phone]

## 2013-11-25 NOTE — Evaluation (Signed)
Occupational Therapy Evaluation Patient Details Name: Arie Powell MRN: [id number] DOB: April 16, 1951 Today's Date: 11/25/2013    History of Present Illness Bil TKR   Clinical Impression   Pt presents to OT with decreased I with ADL activity and will benefit from skilled OT to increase I with ADL activity and return to PLOF    Follow Up Recommendations  Home health OT    Equipment Recommendations  None recommended by OT       Precautions / Restrictions Precautions Precautions: Fall;Knee Required Braces or Orthoses: Knee Immobilizer - Right;Knee Immobilizer - Left Knee Immobilizer - Right: Discontinue once straight leg raise with < 10 degree lag Knee Immobilizer - Left: Discontinue once straight leg raise with < 10 degree lag Restrictions Weight Bearing Restrictions: No Other Position/Activity Restrictions: WBAT      Mobility Bed Mobility Overal bed mobility: Needs Assistance Bed Mobility: Supine to Sit;Sit to Supine     Supine to sit: Mod assist Sit to supine: Mod assist   General bed mobility comments: cues for sequence with physical assist for Bil LEs  Transfers Overall transfer level: Needs assistance Equipment used: Rolling walker (2 wheeled)   Sit to Stand: From elevated surface;+2 safety/equipment;+2 physical assistance         General transfer comment: cues for LE management and use of UEs to self assist.  Utilized bed to bring pt to near standing         ADL Overall ADL's : Needs assistance/impaired Eating/Feeding: Set up   Grooming: Set up   Upper Body Bathing: Set up;Sitting   Lower Body Bathing: Sit to/from stand;Maximal assistance   Upper Body Dressing : Set up;Sitting   Lower Body Dressing: Sit to/from stand;Maximal assistance       Toileting- Clothing Manipulation and Hygiene: Sit to/from stand;+2 for safety/equipment;Moderate assistance                         Pertinent Vitals/Pain Pain Score: 2  Pain Location: bilateral  knees     Hand Dominance Right   Extremity/Trunk Assessment Upper Extremity Assessment Upper Extremity Assessment: Overall WFL for tasks assessed           Communication Communication Communication: No difficulties   Cognition Arousal/Alertness: Awake/alert Behavior During Therapy: WFL for tasks assessed/performed Overall Cognitive Status: Within Functional Limits for tasks assessed                                Home Living Family/patient expects to be discharged to:: Private residence Living Arrangements: Spouse/significant other Available Help at Discharge: Family Type of Home: House Home Access: Stairs to enter Technical brewer of Steps: 5 Entrance Stairs-Rails: Right Home Layout: One level     Bathroom Shower/Tub: Occupational psychologist: Standard     Home Equipment: Environmental consultant - 2 wheels;Cane - single point;Bedside commode          Prior Functioning/Environment Level of Independence: Independent             OT Diagnosis: Generalized weakness   OT Problem List: Decreased strength   OT Treatment/Interventions: Self-care/ADL training;Patient/family education;DME and/or AE instruction    OT Goals(Current goals can be found in the care plan section) Acute Rehab OT Goals Patient Stated Goal: Home from hospital and back to work ASAP  OT Frequency: Min 2X/week    End of Session Nurse Communication: Mobility status  Activity Tolerance: Patient tolerated  treatment well Patient left: in chair;with call bell/phone within reach   Time: 1035-1105 OT Time Calculation (min): 30 min Charges:  OT General Charges $OT Visit: 1 Procedure OT Evaluation $Initial OT Evaluation Tier I: 1 Procedure OT Treatments $Self Care/Home Management : 23-37 mins G-Codes:    Betsy Pries 12/16/13, 11:32 AM

## 2013-11-25 NOTE — Progress Notes (Signed)
Subjective: 2 Days Post-Op Procedure(s) (LRB): TOTAL KNEE BILATERAL (Bilateral) Patient reports pain as moderate.  Asymptomatic acute blood loss anemia.  Epidural out.  Objective: Vital signs in last 24 hours: Temp:  [98 F (36.7 C)-98.3 F (36.8 C)] 98.2 F (36.8 C) (08/09 0500) Pulse Rate:  [60-77] 70 (08/09 0500) Resp:  [16] 16 (08/08 2145) BP: (120-154)/(66-94) 154/94 mmHg (08/09 0500) SpO2:  [93 %-100 %] 96 % (08/09 0500)  Intake/Output from previous day: 08/08 0701 - 08/09 0700 In: 1080 [P.O.:1080] Out: 2475 [Urine:2475] Intake/Output this shift:     Recent Labs  11/24/13 0913  HGB 9.7*    Recent Labs  11/24/13 0913  WBC 7.3  RBC 3.59*  HCT 29.6*  PLT 171    Recent Labs  11/24/13 0913  NA 135*  K 3.7  CL 99  CO2 27  BUN 22  CREATININE 1.01  GLUCOSE 172*  CALCIUM 7.8*   No results found for this basename: LABPT, INR,  in the last 72 hours  Sensation intact distally Intact pulses distally Dorsiflexion/Plantar flexion intact Incision: scant drainage No cellulitis present Compartment soft  Assessment/Plan: 2 Days Post-Op Procedure(s) (LRB): TOTAL KNEE BILATERAL (Bilateral) Up with therapy Discharge to home in the next 1-2 days. Check CBC tomorrow am.  Mcarthur Rossetti 11/25/2013, 9:15 AM

## 2013-11-26 DIAGNOSIS — Z96659 Presence of unspecified artificial knee joint: Secondary | ICD-10-CM

## 2013-11-26 DIAGNOSIS — M1710 Unilateral primary osteoarthritis, unspecified knee: Secondary | ICD-10-CM

## 2013-11-26 LAB — CBC
HCT: 25.7 % — ABNORMAL LOW (ref 39.0–52.0)
Hemoglobin: 8.6 g/dL — ABNORMAL LOW (ref 13.0–17.0)
MCH: 27.7 pg (ref 26.0–34.0)
MCHC: 33.5 g/dL (ref 30.0–36.0)
MCV: 82.6 fL (ref 78.0–100.0)
Platelets: 145 10*3/uL — ABNORMAL LOW (ref 150–400)
RBC: 3.11 MIL/uL — ABNORMAL LOW (ref 4.22–5.81)
RDW: 15.9 % — ABNORMAL HIGH (ref 11.5–15.5)
WBC: 6.9 10*3/uL (ref 4.0–10.5)

## 2013-11-26 MED ORDER — FLUOXETINE HCL 20 MG PO CAPS
20.0000 mg | ORAL_CAPSULE | Freq: Every day | ORAL | Status: DC
  Administered 2013-11-26 – 2013-11-27 (×2): 20 mg via ORAL
  Filled 2013-11-26 (×2): qty 1

## 2013-11-26 NOTE — Consult Note (Signed)
Physical Medicine and Rehabilitation Consult Reason for Consult: Bilateral total knee arthroplasties Referring Physician: Dr. Ninfa Linden   HPI: Miguel Morris is a 63 y.o. right-handed male with history of bilateral knee pain secondary to end-stage osteoarthritis and no relief with conservative care. Underwent bilateral total knee arthroplasties 11/23/2013 per Dr. Ninfa Linden. Postoperative pain management. Weightbearing as tolerated bilateral lower extremities. Placed on Xarelto for DVT prophylaxis. Acute blood loss anemia 8.6 and monitored. Physical and occupational therapy evaluations completed 11/24/2013. M.D. has requested physical medicine rehabilitation consult   Review of Systems  Gastrointestinal: Positive for constipation.       GERD  Musculoskeletal: Positive for joint pain and myalgias.  Psychiatric/Behavioral: Positive for depression.  All other systems reviewed and are negative.  Past Medical History  Diagnosis Date  . Arthritis     oa and pain both knees  . Heart murmur     STATES BORN WITH HEART MURMUR BUT NO LONGER HAS THE MURMUR  . GERD (gastroesophageal reflux disease)     TUMS EVERY NIGHT  . Prostate enlargement     HX OF ELEVATED PSA - DR. TANNENBAUM IS PT'[S UROLOGIST   Past Surgical History  Procedure Laterality Date  . Broken clavicle 1988    . Tonsillectomy      AS A CHILD   History reviewed. No pertinent family history. Social History:  reports that he has quit smoking. He has never used smokeless tobacco. He reports that he drinks alcohol. He reports that he does not use illicit drugs. Allergies: No Known Allergies Medications Prior to Admission  Medication Sig Dispense Refill  . Ascorbic Acid (VITAMIN C PO) Take 1 tablet by mouth daily.      . Calcium Carbonate Antacid (TUMS PO) Take by mouth. EVERY NIGHT      . FLUoxetine (PROZAC) 20 MG capsule Take 20 mg by mouth every morning.      Marland Kitchen GLUCOSAMINE CHONDROITIN COMPLX PO Take 1 tablet by mouth  daily.      . Omega-3 Fatty Acids (FISH OIL PO) Take by mouth. 1000 mg daily      . OVER THE COUNTER MEDICATION Sal palmetto - 2 pills a day      . VITAMIN E PO Take 1 tablet by mouth daily.        Home: Home Living Family/patient expects to be discharged to:: Private residence Living Arrangements: Spouse/significant other Available Help at Discharge: Family Type of Home: House Home Access: Stairs to enter Technical brewer of Steps: 5 Entrance Stairs-Rails: Right Home Layout: One [place]: Environmental consultant - 2 wheels;Cane - single point;Bedside commode  Functional History: Prior Function Level of Independence: Independent Functional Status:  Mobility: Bed Mobility Overal bed mobility: Needs Assistance Bed Mobility: Sit to Supine Supine to sit: Mod assist Sit to supine: Mod assist General bed mobility comments: assist with B LE's up onto bed Transfers Overall transfer level: Needs assistance Equipment used: Rolling walker (2 wheeled) Transfers: Sit to/from Stand Sit to Stand: Max assist General transfer comment: from elevated bed surface Ambulation/Gait Ambulation/Gait assistance: +2 physical assistance;Min assist Ambulation Distance (Feet): 25 Feet (to and from BR only) Assistive device: Rolling walker (2 wheeled) Gait Pattern/deviations: Step-to pattern;Decreased step length - right;Decreased step length - left Gait velocity: decr General Gait Details: increased time and 50% VC's on proper walker to gait distance.  Amb distance limited by increased c/o dizziness after amb to BR.  So only amb back to bed vs hallway.  ADL: ADL Overall ADL's : Needs assistance/impaired Eating/Feeding: Set up Grooming: Set up Upper Body Bathing: Set up;Sitting Lower Body Bathing: Sit to/from stand;Maximal assistance Upper Body Dressing : Set up;Sitting Lower Body Dressing: Sit to/from stand;Maximal assistance;With adaptive equipment Lower Body Dressing Details (indicate  cue type and reason): AE instruction Toileting- Clothing Manipulation and Hygiene: Sit to/from stand;Maximal assistance Toileting - Clothing Manipulation Details (indicate cue type and reason): from elevated surface  Cognition: Cognition Overall Cognitive Status: Within Functional Limits for tasks assessed Orientation Level: Oriented X4 Cognition Arousal/Alertness: Awake/alert Behavior During Therapy: WFL for tasks assessed/performed Overall Cognitive Status: Within Functional Limits for tasks assessed  Blood pressure 134/82, pulse 74, temperature 98.6 F (37 C), temperature source Oral, resp. rate 16, height 6' (1.829 m), weight 81.194 kg (179 lb), SpO2 100.00%. Physical Exam  Constitutional: He is oriented to person, place, and time.  HENT:  Head: Normocephalic.  Eyes: EOM are normal.  Neck: Normal range of motion. Neck supple. No thyromegaly present.  Cardiovascular: Normal rate and regular rhythm.   Respiratory: Effort normal and breath sounds normal. No respiratory distress.  GI: Soft. Bowel sounds are normal. He exhibits no distension.  Neurological: He is alert and oriented to person, place, and time.  Skin:  Bilateral knee incisions dressed  Psychiatric: He has a normal mood and affect.    Results for orders placed during the hospital encounter of 11/23/13 (from the past 24 hour(s))  CBC     Status: Abnormal   Collection Time    11/26/13  5:15 AM      Result Value Ref Range   WBC 6.9  4.0 - 10.5 K/uL   RBC 3.11 (*) 4.22 - 5.81 MIL/uL   Hemoglobin 8.6 (*) 13.0 - 17.0 g/dL   HCT 25.7 (*) 39.0 - 52.0 %   MCV 82.6  78.0 - 100.0 fL   MCH 27.7  26.0 - 34.0 pg   MCHC 33.5  30.0 - 36.0 g/dL   RDW 15.9 (*) 11.5 - 15.5 %   Platelets 145 (*) 150 - 400 K/uL   No results found.  Assessment/Plan: Diagnosis: Bilateral end stage osteoarthritis in the knee status post total knee replacements 11/24/2011 1. Does the need for close, 24 hr/day medical supervision in concert with  the patient's rehab needs make it unreasonable for this patient to be served in a less intensive setting? Yes 2. Co-Morbidities requiring supervision/potential complications: Pain management, constipation vs. postoperative ileus 3. Due to bladder management, bowel management, safety, skin/wound care, disease management, medication administration, pain management and patient education, does the patient require 24 hr/day rehab nursing? Yes 4. Does the patient require coordinated care of a physician, rehab nurse, PT (1-2 hrs/day, 5 days/week) and OT (1-2 hrs/day, 5 days/week) to address physical and functional deficits in the context of the above medical diagnosis(es)? Yes Addressing deficits in the following areas: balance, endurance, locomotion, strength, transferring, bowel/bladder control, bathing, dressing, feeding, grooming and toileting 5. Can the patient actively participate in an intensive therapy program of at least 3 hrs of therapy per day at least 5 days per week? Yes 6. The potential for patient to make measurable gains while on inpatient rehab is excellent 7. Anticipated functional outcomes upon discharge from inpatient rehab are modified independent  with PT, modified independent with OT, n/a with SLP. 8. Estimated rehab length of stay to reach the above functional goals is: 7 days 9. Does the patient have adequate social supports to accommodate these discharge functional goals? Yes 10.  Anticipated D/C setting: Home 11. Anticipated post D/C treatments: [place] therapy 12. Overall Rehab/Functional Prognosis: excellent  RECOMMENDATIONS: This patient's condition is appropriate for continued rehabilitative care in the following setting: CIR Patient has agreed to participate in recommended program. Yes Note that insurance prior authorization may be required for reimbursement for recommended care.  Comment: Pain management issues continue complicated by severe constipation    11/26/2013

## 2013-11-26 NOTE — Progress Notes (Signed)
Subjective: 3 Days Post-Op Procedure(s) (LRB): TOTAL KNEE BILATERAL (Bilateral) Patient reports pain as moderate.  Acute blood loss anemia, but asymptomatic.  Objective: Vital signs in last 24 hours: Temp:  [97.9 F (36.6 C)-100.2 F (37.9 C)] 98.6 F (37 C) (08/10 0415) Pulse Rate:  [66-74] 74 (08/10 0415) Resp:  [16-18] 16 (08/10 0415) BP: (127-155)/(79-85) 134/82 mmHg (08/10 0415) SpO2:  [100 %] 100 % (08/10 0415)  Intake/Output from previous day: 08/09 0701 - 08/10 0700 In: 1470 [P.O.:1470] Out: 1350 [Urine:1350] Intake/Output this shift:     Recent Labs  11/24/13 0913 11/26/13 0515  HGB 9.7* 8.6*    Recent Labs  11/24/13 0913 11/26/13 0515  WBC 7.3 6.9  RBC 3.59* 3.11*  HCT 29.6* 25.7*  PLT 171 145*    Recent Labs  11/24/13 0913  NA 135*  K 3.7  CL 99  CO2 27  BUN 22  CREATININE 1.01  GLUCOSE 172*  CALCIUM 7.8*   No results found for this basename: LABPT, INR,  in the last 72 hours  Sensation intact distally Intact pulses distally Dorsiflexion/Plantar flexion intact Incision: scant drainage No cellulitis present Compartment soft  Assessment/Plan: 3 Days Post-Op Procedure(s) (LRB): TOTAL KNEE BILATERAL (Bilateral) Plan for discharge tomorrow  Mcarthur Rossetti 11/26/2013, 7:22 AM

## 2013-11-26 NOTE — Progress Notes (Signed)
Physical Therapy Treatment Patient Details Name: Miguel Morris MRN: [id number] DOB: 1950/05/02 Today's Date: 11/26/2013    History of Present Illness Bil TKR    PT Comments    Pt progressing with ambulation today.  Pt considering CIR at this time.  Pt also performed exercises once seated in recliner.  Follow Up Recommendations  CIR     Equipment Recommendations  None recommended by PT    Recommendations for Other Services       Precautions / Restrictions Precautions Precautions: Fall;Knee Precaution Comments: during ambulation today, removed R KI Required Braces or Orthoses: Knee Immobilizer - Right;Knee Immobilizer - Left Knee Immobilizer - Right: Discontinue once straight leg raise with < 10 degree lag Knee Immobilizer - Left: Discontinue once straight leg raise with < 10 degree lag Restrictions Weight Bearing Restrictions: No Other Position/Activity Restrictions: WBAT    Mobility  Bed Mobility Overal bed mobility: Needs Assistance Bed Mobility: Sit to Supine       Sit to supine: Mod assist   General bed mobility comments: pt in bathroom on toilet upon arrival  Transfers Overall transfer level: Needs assistance Equipment used: Rolling walker (2 wheeled) Transfers: Sit to/from Stand Sit to Stand: Mod assist;+2 physical assistance;+2 safety/equipment         General transfer comment: verbal cues for UE and LE management as well as weight shifting  Ambulation/Gait Ambulation/Gait assistance: Min assist;+2 safety/equipment Ambulation Distance (Feet): 60 Feet Assistive device: Rolling walker (2 wheeled) Gait Pattern/deviations: Step-to pattern Gait velocity: decr   General Gait Details: removed R KI upon leaving room, no buckling observed, step to pattern   Stairs            Wheelchair Mobility    Modified Rankin (Stroke Patients Only)       Balance                                    Cognition Arousal/Alertness:  Awake/alert Behavior During Therapy: WFL for tasks assessed/performed Overall Cognitive Status: Within Functional Limits for tasks assessed                      Exercises Total Joint Exercises Ankle Circles/Pumps: AROM;Both;10 reps;Supine Quad Sets: AROM;Both;10 reps;Supine Short Arc Quad: AAROM;Both;15 reps;Supine Heel Slides: AAROM;Both;Supine;15 reps Straight Leg Raises: AAROM;Both;10 reps;Supine    General Comments        Pertinent Vitals/Pain Pain Assessment: 0-10 Pain Score: 2  Pain Location: bilateral knees Pain Descriptors / Indicators: Sore Pain Intervention(s): Limited activity within patient's tolerance;Monitored during session;Repositioned;Ice applied    Home Living                      Prior Function            PT Goals (current goals can now be found in the care plan section) Acute Rehab PT Goals Patient Stated Goal: i think i need to go to rehab Progress towards PT goals: Progressing toward goals    Frequency  7X/week    PT Plan Current plan remains appropriate    Co-evaluation             End of Session Equipment Utilized During Treatment: Right knee immobilizer;Left knee immobilizer;Gait belt Activity Tolerance: Patient tolerated treatment well Patient left: with call bell/phone within reach;with family/visitor present;in chair     Time: 1122-1200 PT Time Calculation (min): 38 min  Charges:  $Gait Training:  23-37 mins $Therapeutic Exercise: 8-22 mins                    G Codes:      Miguel Morris,Miguel Morris 04-Dec-2013, 12:55 PM Miguel Morris, PT, DPT 12/04/2013 Pager: [phone]

## 2013-11-26 NOTE — Anesthesia Postprocedure Evaluation (Signed)
Anesthesia Post Note  Patient: Miguel Morris  Procedure(s) Performed: Procedure(s) (LRB): TOTAL KNEE BILATERAL (Bilateral)  Anesthesia type: Epidural  Patient location: PACU  Post pain: Pain level controlled  Post assessment: Post-op Vital signs reviewed  Last Vitals: BP 127/79  Pulse 72  Temp(Src) 37.9 C (Oral)  Resp 18  Ht 6' (1.829 m)  Wt 179 lb (81.194 kg)  BMI 24.27 kg/m2  SpO2 100%  Post vital signs: Reviewed  Level of consciousness: sedated  Complications: No apparent anesthesia complications

## 2013-11-26 NOTE — Progress Notes (Signed)
CSW met with pt to assist with d/c planning. Pt does not feel he will be ready to return home tomorrow. He is interested in CIR. Pt will consider SNF placement if CIR is unable to assist.   Werner Lean LCSW [phone]

## 2013-11-26 NOTE — Progress Notes (Signed)
Occupational Therapy Treatment Patient Details Name: Miguel Morris MRN: [id number] DOB: 1950-10-20 Today's Date: 11/26/2013    History of present illness Bil TKR      Follow Up Recommendations  CIR    Equipment Recommendations  None recommended by OT    Recommendations for Other Services Rehab consult    Precautions / Restrictions Precautions Precautions: Fall;Knee Precaution Comments: instructed on B KI use for amb Required Braces or Orthoses: Knee Immobilizer - Right;Knee Immobilizer - Left Knee Immobilizer - Right: Discontinue once straight leg raise with < 10 degree lag Knee Immobilizer - Left: Discontinue once straight leg raise with < 10 degree lag Restrictions Weight Bearing Restrictions: No Other Position/Activity Restrictions: WBAT       Mobility Bed Mobility Overal bed mobility: Needs Assistance Bed Mobility: Sit to Supine       Sit to supine: Mod assist   General bed mobility comments: assist with B LE's up onto bed  Transfers Overall transfer level: Needs assistance Equipment used: Rolling walker (2 wheeled) Transfers: Sit to/from Stand Sit to Stand: Max assist         General transfer comment: from elevated bed surface        ADL                       Lower Body Dressing: Sit to/from stand;Maximal assistance;With adaptive equipment Lower Body Dressing Details (indicate cue type and reason): AE instruction     Toileting- Clothing Manipulation and Hygiene: Sit to/from stand;Maximal assistance Toileting - Clothing Manipulation Details (indicate cue type and reason): from elevated surface                        Cognition   Behavior During Therapy: [facility] for tasks assessed/performed Overall Cognitive Status: Within Functional Limits for tasks assessed                               General Comments      Pertinent Vitals/ Pain       Pain Score: 2  Pain Location: bilateral knees            Progress Toward  Goals  OT Goals(current goals can now be found in the care plan section)  Progress towards OT goals: Progressing toward goals  Acute Rehab OT Goals Patient Stated Goal: i think i need to go to rehab  Plan Discharge plan needs to be updated       End of Session CPM Left Knee CPM Left Knee: Off CPM Right Knee CPM Right Knee: Off   Activity Tolerance Patient tolerated treatment well   Patient Left in chair;with call bell/phone within reach   Nurse Communication Mobility status        Time: 7371-0626 OT Time Calculation (min): 47 min  Charges: OT General Charges $OT Visit: 1 Procedure OT Treatments $Self Care/Home Management : 38-52 mins  Sandar Krinke, Thereasa Parkin 11/26/2013, 9:36 AM

## 2013-11-26 NOTE — Progress Notes (Signed)
Inpatient Rehabilitation  I met with the patient and his wife at the bedside to discuss his post acute rehab options.  I  provided them info and booklets on IP rehab.  They state they prefer pt. to come to IP Rehab for his rehab recovery before home DC .  I will initiate insurance authorization process and will update the acute team as soon as I have a decision.  Please call if questions.  [facility] Admissions Coordinator Cell [phone] Office [phone]

## 2013-11-26 NOTE — Progress Notes (Signed)
Physical Therapy Treatment Patient Details Name: Miguel Morris MRN: [id number] DOB: 30-Jul-1950 Today's Date: 12-25-2013    History of Present Illness Bil TKR    PT Comments    Pt ambulated in hallway again this afternoon and then assisted back to bed.  Follow Up Recommendations  CIR     Equipment Recommendations  None recommended by PT    Recommendations for Other Services       Precautions / Restrictions Precautions Precautions: Fall;Knee Precaution Comments: removed R KI for ambulation Required Braces or Orthoses: Knee Immobilizer - Right;Knee Immobilizer - Left Knee Immobilizer - Right: Discontinue once straight leg raise with < 10 degree lag Knee Immobilizer - Left: Discontinue once straight leg raise with < 10 degree lag Restrictions Other Position/Activity Restrictions: WBAT    Mobility  Bed Mobility Overal bed mobility: Needs Assistance Bed Mobility: Sit to Supine       Sit to supine: Min assist   General bed mobility comments: slight assist for LEs onto bed  Transfers Overall transfer level: Needs assistance Equipment used: Rolling walker (2 wheeled) Transfers: Sit to/from Stand Sit to Stand: Mod assist;+2 physical assistance;+2 safety/equipment         General transfer comment: verbal cues for UE and LE management as well as weight shifting  Ambulation/Gait Ambulation/Gait assistance: Min assist;+2 safety/equipment Ambulation Distance (Feet): 60 Feet Assistive device: Rolling walker (2 wheeled) Gait Pattern/deviations: Step-to pattern Gait velocity: decr   General Gait Details: verbal cues for posture   Stairs            Wheelchair Mobility    Modified Rankin (Stroke Patients Only)       Balance                                    Cognition Arousal/Alertness: Awake/alert Behavior During Therapy: WFL for tasks assessed/performed Overall Cognitive Status: Within Functional Limits for tasks assessed                       Exercises      General Comments        Pertinent Vitals/Pain Pain Assessment: 0-10 Pain Score: 4  Pain Location: bilateral knees Pain Descriptors / Indicators: Sore Pain Intervention(s): Limited activity within patient's tolerance;Monitored during session;Repositioned;Ice applied    Home Living                      Prior Function            PT Goals (current goals can now be found in the care plan section) Progress towards PT goals: Progressing toward goals    Frequency  7X/week    PT Plan Current plan remains appropriate    Co-evaluation             End of Session Equipment Utilized During Treatment: Left knee immobilizer;Gait belt Activity Tolerance: Patient tolerated treatment well Patient left: with call bell/phone within reach;with family/visitor present;in bed     Time: 5573-2202 PT Time Calculation (min): 16 min  Charges:  $Gait Training: 8-22 mins                    G Codes:      Jenavive Lamboy,KATHrine E 12-25-13, 5:48 PM Carmelia Bake, PT, DPT 12/25/2013 Pager: [phone]

## 2013-11-27 ENCOUNTER — Encounter (HOSPITAL_COMMUNITY): Payer: Self-pay | Admitting: Orthopaedic Surgery

## 2013-11-27 ENCOUNTER — Inpatient Hospital Stay (HOSPITAL_COMMUNITY)
Admission: AD | Admit: 2013-11-27 | Discharge: 2013-12-03 | DRG: 945 | Disposition: A | Discharge status: Home Health | Payer: Managed Care, Other (non HMO) | Source: Intra-hospital | Attending: Physical Medicine & Rehabilitation | Admitting: Physical Medicine & Rehabilitation

## 2013-11-27 DIAGNOSIS — F3289 Other specified depressive episodes: Secondary | ICD-10-CM | POA: Diagnosis present

## 2013-11-27 DIAGNOSIS — N400 Enlarged prostate without lower urinary tract symptoms: Secondary | ICD-10-CM | POA: Diagnosis present

## 2013-11-27 DIAGNOSIS — K5900 Constipation, unspecified: Secondary | ICD-10-CM | POA: Diagnosis present

## 2013-11-27 DIAGNOSIS — M1710 Unilateral primary osteoarthritis, unspecified knee: Secondary | ICD-10-CM | POA: Diagnosis present

## 2013-11-27 DIAGNOSIS — Z87891 Personal history of nicotine dependence: Secondary | ICD-10-CM | POA: Diagnosis not present

## 2013-11-27 DIAGNOSIS — Z79899 Other long term (current) drug therapy: Secondary | ICD-10-CM | POA: Diagnosis not present

## 2013-11-27 DIAGNOSIS — D62 Acute posthemorrhagic anemia: Secondary | ICD-10-CM | POA: Diagnosis present

## 2013-11-27 DIAGNOSIS — Z96653 Presence of artificial knee joint, bilateral: Secondary | ICD-10-CM

## 2013-11-27 DIAGNOSIS — Z5189 Encounter for other specified aftercare: Principal | ICD-10-CM

## 2013-11-27 DIAGNOSIS — F329 Major depressive disorder, single episode, unspecified: Secondary | ICD-10-CM | POA: Diagnosis present

## 2013-11-27 DIAGNOSIS — K219 Gastro-esophageal reflux disease without esophagitis: Secondary | ICD-10-CM | POA: Diagnosis present

## 2013-11-27 DIAGNOSIS — Z96659 Presence of unspecified artificial knee joint: Secondary | ICD-10-CM | POA: Diagnosis not present

## 2013-11-27 DIAGNOSIS — Z7901 Long term (current) use of anticoagulants: Secondary | ICD-10-CM | POA: Diagnosis not present

## 2013-11-27 DIAGNOSIS — I824Z9 Acute embolism and thrombosis of unspecified deep veins of unspecified distal lower extremity: Secondary | ICD-10-CM | POA: Diagnosis present

## 2013-11-27 LAB — CBC
HCT: 26.8 % — ABNORMAL LOW (ref 39.0–52.0)
Hemoglobin: 8.9 g/dL — ABNORMAL LOW (ref 13.0–17.0)
MCH: 27.6 pg (ref 26.0–34.0)
MCHC: 33.2 g/dL (ref 30.0–36.0)
MCV: 83 fL (ref 78.0–100.0)
PLATELETS: 162 10*3/uL (ref 150–400)
RBC: 3.23 MIL/uL — AB (ref 4.22–5.81)
RDW: 15.9 % — AB (ref 11.5–15.5)
WBC: 7 10*3/uL (ref 4.0–10.5)

## 2013-11-27 MED ORDER — ONDANSETRON HCL 4 MG PO TABS: 4.0000 mg | ORAL_TABLET | Freq: Four times a day (QID) | ORAL | Status: DC | PRN

## 2013-11-27 MED ORDER — ZOLPIDEM TARTRATE 5 MG PO TABS
5.0000 mg | ORAL_TABLET | Freq: Every evening | ORAL | Status: DC | PRN
  Administered 2013-11-28 – 2013-12-02 (×5): 5 mg via ORAL
  Filled 2013-11-27 (×6): qty 1

## 2013-11-27 MED ORDER — OXYCODONE HCL 5 MG PO TABS
5.0000 mg | ORAL_TABLET | ORAL | Status: DC | PRN
  Administered 2013-11-28: 10 mg via ORAL
  Administered 2013-11-28 (×2): 15 mg via ORAL
  Administered 2013-11-28: 5 mg via ORAL
  Administered 2013-11-28: 10 mg via ORAL
  Administered 2013-11-29 (×2): 5 mg via ORAL
  Administered 2013-11-29 – 2013-12-01 (×6): 10 mg via ORAL
  Administered 2013-12-01: 15 mg via ORAL
  Administered 2013-12-01 (×4): 10 mg via ORAL
  Administered 2013-12-02 (×6): 15 mg via ORAL
  Administered 2013-12-03: 10 mg via ORAL
  Administered 2013-12-03: 15 mg via ORAL
  Filled 2013-11-27: qty 2
  Filled 2013-11-27: qty 3
  Filled 2013-11-27 (×2): qty 1
  Filled 2013-11-27: qty 2
  Filled 2013-11-27: qty 1
  Filled 2013-11-27 (×2): qty 2
  Filled 2013-11-27 (×5): qty 3
  Filled 2013-11-27 (×4): qty 2
  Filled 2013-11-27 (×2): qty 3
  Filled 2013-11-27: qty 2
  Filled 2013-11-27 (×2): qty 3
  Filled 2013-11-27: qty 2
  Filled 2013-11-27: qty 3
  Filled 2013-11-27 (×3): qty 2

## 2013-11-27 MED ORDER — ACETAMINOPHEN 325 MG PO TABS
325.0000 mg | ORAL_TABLET | ORAL | Status: DC | PRN
  Administered 2013-11-28: 650 mg via ORAL
  Filled 2013-11-27: qty 2

## 2013-11-27 MED ORDER — ONDANSETRON HCL 4 MG/2ML IJ SOLN: 4.0000 mg | Freq: Four times a day (QID) | INTRAMUSCULAR | Status: DC | PRN

## 2013-11-27 MED ORDER — METHOCARBAMOL 500 MG PO TABS
500.0000 mg | ORAL_TABLET | Freq: Four times a day (QID) | ORAL | Status: DC | PRN
  Administered 2013-11-27 – 2013-12-02 (×5): 500 mg via ORAL
  Filled 2013-11-27 (×5): qty 1

## 2013-11-27 MED ORDER — SORBITOL 70 % SOLN: 30.0000 mL | Freq: Every day | Status: DC | PRN

## 2013-11-27 MED ORDER — DOCUSATE SODIUM 100 MG PO CAPS
100.0000 mg | ORAL_CAPSULE | Freq: Two times a day (BID) | ORAL | Status: DC
  Administered 2013-11-28 – 2013-12-03 (×11): 100 mg via ORAL
  Filled 2013-11-27 (×16): qty 1

## 2013-11-27 MED ORDER — RIVAROXABAN 10 MG PO TABS
10.0000 mg | ORAL_TABLET | Freq: Every day | ORAL | Status: DC
  Filled 2013-11-27: qty 1

## 2013-11-27 MED ORDER — FERROUS SULFATE 325 (65 FE) MG PO TABS
325.0000 mg | ORAL_TABLET | Freq: Three times a day (TID) | ORAL | Status: DC
  Administered 2013-11-28 – 2013-12-03 (×17): 325 mg via ORAL
  Filled 2013-11-27 (×22): qty 1

## 2013-11-27 MED ORDER — FLUOXETINE HCL 20 MG PO CAPS
20.0000 mg | ORAL_CAPSULE | Freq: Every day | ORAL | Status: DC
  Administered 2013-11-28 – 2013-12-03 (×6): 20 mg via ORAL
  Filled 2013-11-27 (×8): qty 1

## 2013-11-27 MED ORDER — FLUOXETINE HCL 20 MG PO CAPS: 20.0000 mg | ORAL_CAPSULE | Freq: Every day | ORAL | Status: DC

## 2013-11-27 MED ORDER — ALUM & MAG HYDROXIDE-SIMETH 200-200-20 MG/5ML PO SUSP: 30.0000 mL | ORAL | Status: DC | PRN

## 2013-11-27 NOTE — Progress Notes (Signed)
Charlett Blake, MD Physician Signed Physical Medicine and Rehabilitation Consult Note Service date: 11/26/2013 10:13 AM  Related encounter: Admission (Current) from 11/23/2013 in [place]    Physical Medicine and Rehabilitation Consult  Reason for Consult: Bilateral total knee arthroplasties  Referring Physician: Dr. Ninfa Linden  HPI: Miguel Morris is a 63 y.o. right-handed male with history of bilateral knee pain secondary to end-stage osteoarthritis and no relief with conservative care. Underwent bilateral total knee arthroplasties 11/23/2013 per Dr. Ninfa Linden. Postoperative pain management. Weightbearing as tolerated bilateral lower extremities. Placed on Xarelto for DVT prophylaxis. Acute blood loss anemia 8.6 and monitored. Physical and occupational therapy evaluations completed 11/24/2013. M.D. has requested physical medicine rehabilitation consult  Review of Systems  Gastrointestinal: Positive for constipation.  GERD  Musculoskeletal: Positive for joint pain and myalgias.  Psychiatric/Behavioral: Positive for depression.  All other systems reviewed and are negative.     Past Medical History    Diagnosis  Date    .  Arthritis       oa and pain both knees    .  Heart murmur       STATES BORN WITH HEART MURMUR BUT NO LONGER HAS THE MURMUR    .  GERD (gastroesophageal reflux disease)       TUMS EVERY NIGHT    .  Prostate enlargement       HX OF ELEVATED PSA - DR. TANNENBAUM IS PT'[S UROLOGIST       Past Surgical History    Procedure  Laterality  Date    .  Broken clavicle 1988      .  Tonsillectomy        AS A CHILD     History reviewed. No pertinent family history.  Social History: reports that he has quit smoking. He has never used smokeless tobacco. He reports that he drinks alcohol. He reports that he does not use illicit drugs.  Allergies: No Known Allergies    Medications Prior to Admission    Medication  Sig  Dispense  Refill    .   Ascorbic Acid (VITAMIN C PO)  Take 1 tablet by mouth daily.      .  Calcium Carbonate Antacid (TUMS PO)  Take by mouth. EVERY NIGHT      .  FLUoxetine (PROZAC) 20 MG capsule  Take 20 mg by mouth every morning.      Marland Kitchen  GLUCOSAMINE CHONDROITIN COMPLX PO  Take 1 tablet by mouth daily.      .  Omega-3 Fatty Acids (FISH OIL PO)  Take by mouth. 1000 mg daily      .  OVER THE COUNTER MEDICATION  Sal palmetto - 2 pills a day      .  VITAMIN E PO  Take 1 tablet by mouth daily.       Home:  Home Living  Family/patient expects to be discharged to:: Private residence  Living Arrangements: Spouse/significant other  Available Help at Discharge: Family  Type of Home: House  Home Access: Stairs to enter  Technical brewer of Steps: 5  Entrance Stairs-Rails: Right  Home Layout: One [place]: Environmental consultant - 2 wheels;Cane - single point;Bedside commode  Functional History:  Prior Function  Level of Independence: Independent  Functional Status:  Mobility:  Bed Mobility  Overal bed mobility: Needs Assistance  Bed Mobility: Sit to Supine  Supine to sit: Mod assist  Sit to supine: Mod assist  General bed mobility comments:  assist with B LE's up onto bed  Transfers  Overall transfer level: Needs assistance  Equipment used: Rolling walker (2 wheeled)  Transfers: Sit to/from Stand  Sit to Stand: Max assist  General transfer comment: from elevated bed surface  Ambulation/Gait  Ambulation/Gait assistance: +2 physical assistance;Min assist  Ambulation Distance (Feet): 25 Feet (to and from BR only)  Assistive device: Rolling walker (2 wheeled)  Gait Pattern/deviations: Step-to pattern;Decreased step length - right;Decreased step length - left  Gait velocity: decr  General Gait Details: increased time and 50% VC's on proper walker to gait distance. Amb distance limited by increased c/o dizziness after amb to BR. So only amb back to bed vs hallway.   ADL:  ADL  Overall ADL's : Needs  assistance/impaired  Eating/Feeding: Set up  Grooming: Set up  Upper Body Bathing: Set up;Sitting  Lower Body Bathing: Sit to/from stand;Maximal assistance  Upper Body Dressing : Set up;Sitting  Lower Body Dressing: Sit to/from stand;Maximal assistance;With adaptive equipment  Lower Body Dressing Details (indicate cue type and reason): AE instruction  Toileting- Clothing Manipulation and Hygiene: Sit to/from stand;Maximal assistance  Toileting - Clothing Manipulation Details (indicate cue type and reason): from elevated surface  Cognition:  Cognition  Overall Cognitive Status: Within Functional Limits for tasks assessed  Orientation Level: Oriented X4  Cognition  Arousal/Alertness: Awake/alert  Behavior During Therapy: WFL for tasks assessed/performed  Overall Cognitive Status: Within Functional Limits for tasks assessed  Blood pressure 134/82, pulse 74, temperature 98.6 F (37 C), temperature source Oral, resp. rate 16, height 6' (1.829 m), weight 81.194 kg (179 lb), SpO2 100.00%.  Physical Exam  Constitutional: He is oriented to person, place, and time.  HENT:  Head: Normocephalic.  Eyes: EOM are normal.  Neck: Normal range of motion. Neck supple. No thyromegaly present.  Cardiovascular: Normal rate and regular rhythm.  Respiratory: Effort normal and breath sounds normal. No respiratory distress.  GI: Soft. Bowel sounds are normal. He exhibits no distension.  Neurological: He is alert and oriented to person, place, and time.  Skin:  Bilateral knee incisions dressed  Psychiatric: He has a normal mood and affect.     Results for orders placed during the hospital encounter of 11/23/13 (from the past 24 hour(s))    CBC Status: Abnormal     Collection Time     11/26/13 5:15 AM    Result  Value  Ref Range     WBC  6.9  4.0 - 10.5 K/uL     RBC  3.11 (*)  4.22 - 5.81 MIL/uL     Hemoglobin  8.6 (*)  13.0 - 17.0 g/dL     HCT  25.7 (*)  39.0 - 52.0 %     MCV  82.6  78.0 - 100.0 fL      MCH  27.7  26.0 - 34.0 pg     MCHC  33.5  30.0 - 36.0 g/dL     RDW  15.9 (*)  11.5 - 15.5 %     Platelets  145 (*)  150 - 400 K/uL     No results found.  Assessment/Plan:  Diagnosis: Bilateral end stage osteoarthritis in the knee status post total knee replacements 11/24/2011  1. Does the need for close, 24 hr/day medical supervision in concert with the patient's rehab needs make it unreasonable for this patient to be served in a less intensive setting? Yes 2. Co-Morbidities requiring supervision/potential complications: Pain management, constipation vs. postoperative ileus 3. Due to bladder  management, bowel management, safety, skin/wound care, disease management, medication administration, pain management and patient education, does the patient require 24 hr/day rehab nursing? Yes 4. Does the patient require coordinated care of a physician, rehab nurse, PT (1-2 hrs/day, 5 days/week) and OT (1-2 hrs/day, 5 days/week) to address physical and functional deficits in the context of the above medical diagnosis(es)? Yes Addressing deficits in the following areas: balance, endurance, locomotion, strength, transferring, bowel/bladder control, bathing, dressing, feeding, grooming and toileting 5. Can the patient actively participate in an intensive therapy program of at least 3 hrs of therapy per day at least 5 days per week? Yes 6. The potential for patient to make measurable gains while on inpatient rehab is excellent 7. Anticipated functional outcomes upon discharge from inpatient rehab are modified independent with PT, modified independent with OT, n/a with SLP. 8. Estimated rehab length of stay to reach the above functional goals is: 7 days 9. Does the patient have adequate social supports to accommodate these discharge functional goals? Yes 10. Anticipated D/C setting: Home 11. Anticipated post D/C treatments: [place] therapy 12. Overall Rehab/Functional Prognosis: excellent RECOMMENDATIONS:  This  patient's condition is appropriate for continued rehabilitative care in the following setting: CIR  Patient has agreed to participate in recommended program. Yes  Note that insurance prior authorization may be required for reimbursement for recommended care.  Comment: Pain management issues continue complicated by severe constipation  11/26/2013

## 2013-11-27 NOTE — Discharge Summary (Signed)
Patient ID: Miguel Morris MRN: [id number] DOB/AGE: 63-Oct-1952 63 y.o.  Admit date: 11/23/2013 Discharge date: 11/27/2013  Admission Diagnoses:  Principal Problem:   Degenerative arthritis of knee, bilateral Active Problems:   Status post total bilateral knee replacement   Discharge Diagnoses:  Same  Past Medical History  Diagnosis Date  . Arthritis     oa and pain both knees  . Heart murmur     STATES BORN WITH HEART MURMUR BUT NO LONGER HAS THE MURMUR  . GERD (gastroesophageal reflux disease)     TUMS EVERY NIGHT  . Prostate enlargement     HX OF ELEVATED PSA - DR. TANNENBAUM IS PT'[S UROLOGIST    Surgeries: Procedure(s): TOTAL KNEE BILATERAL on 11/23/2013   Consultants:    Discharged Condition: Improved  Hospital Course: Miguel Morris is an 63 y.o. male who was admitted 11/23/2013 for operative treatment ofDegenerative arthritis of knee, bilateral. Patient has severe unremitting pain that affects sleep, daily activities, and work/hobbies. After pre-op clearance the patient was taken to the operating room on 11/23/2013 and underwent  Procedure(s): TOTAL KNEE BILATERAL.    Patient was given perioperative antibiotics: Anti-infectives   Start     Dose/Rate Route Frequency Ordered Stop   11/23/13 1700  ceFAZolin (ANCEF) IVPB 1 g/50 mL premix     1 g 100 mL/hr over 30 Minutes Intravenous Every 6 hours 11/23/13 1601 11/23/13 2244   11/23/13 0900  ceFAZolin (ANCEF) IVPB 2 g/50 mL premix     2 g 100 mL/hr over 30 Minutes Intravenous On call to O.R. 11/23/13 4098 11/23/13 1035       Patient was given sequential compression devices, early ambulation, and chemoprophylaxis to prevent DVT.  Patient benefited maximally from hospital stay and there were no complications.    Recent vital signs: Patient Vitals for the past 24 hrs:  BP Temp Temp src Pulse Resp SpO2  11/27/13 0432 - - - - 14 -  11/27/13 0235 - - - - 14 -  11/26/13 2200 124/81 mmHg 99.8 F (37.7 C) Oral 77 16 97 %   11/26/13 2036 - - - 80 18 98 %  11/26/13 1352 117/70 mmHg 98.9 F (37.2 C) Oral 90 16 98 %     Recent laboratory studies:  Recent Labs  11/24/13 0913 11/26/13 0515 11/27/13 0007  WBC 7.3 6.9 7.0  HGB 9.7* 8.6* 8.9*  HCT 29.6* 25.7* 26.8*  PLT 171 145* 162  NA 135*  --   --   K 3.7  --   --   CL 99  --   --   CO2 27  --   --   BUN 22  --   --   CREATININE 1.01  --   --   GLUCOSE 172*  --   --   CALCIUM 7.8*  --   --      Discharge Medications:     Medication List         ferrous sulfate 325 (65 FE) MG tablet  Take 1 tablet (325 mg total) by mouth 3 (three) times daily after meals.     FISH OIL PO  Take by mouth. 1000 mg daily     FLUoxetine 20 MG capsule  Commonly known as:  PROZAC  Take 20 mg by mouth every morning.     FLUoxetine 20 MG capsule  Commonly known as:  PROZAC  Take 1 capsule (20 mg total) by mouth daily.     [place]  Take 1 tablet by mouth daily.     methocarbamol 500 MG tablet  Commonly known as:  ROBAXIN  Take 1 tablet (500 mg total) by mouth every 6 (six) hours as needed for muscle spasms.     OVER THE COUNTER MEDICATION  Sal palmetto - 2 pills a day     oxyCODONE-acetaminophen 5-325 MG per tablet  Commonly known as:  ROXICET  Take 1-2 tablets by mouth every 4 (four) hours as needed for severe pain.     rivaroxaban 10 MG Tabs tablet  Commonly known as:  XARELTO  Take 1 tablet (10 mg total) by mouth daily with supper.     TUMS PO  Take by mouth. EVERY NIGHT     VITAMIN C PO  Take 1 tablet by mouth daily.     VITAMIN E PO  Take 1 tablet by mouth daily.        Diagnostic Studies: Dg Knee Left Port  11/23/2013   CLINICAL DATA:  Postop left total knee replacement  EXAM: PORTABLE LEFT KNEE - 1-2 VIEW  COMPARISON:  None.  FINDINGS: Changes of left knee replacement. Soft tissue and joint space gas. Soft tissue drain in place. No hardware or bony complicating feature.  IMPRESSION: Left knee replacement.  No  complicating feature.   Electronically Signed   By: Rolm Baptise M.D.   On: 11/23/2013 14:52   Dg Knee Right Port  11/23/2013   CLINICAL DATA:  Postop right knee replacement  EXAM: PORTABLE RIGHT KNEE - 1-2 VIEW  COMPARISON:  None.  FINDINGS: Changes of right knee replacement. Soft tissue gas scratch has soft tissue and joint space gas present. Soft tissue drain in place. No acute bony abnormality. No hardware or bony complicating feature.  IMPRESSION: Right knee replacement without complicating feature.   Electronically Signed   By: Rolm Baptise M.D.   On: 11/23/2013 14:53    Disposition: to inpatient rehabilitation      Discharge Instructions   Call MD / Call 911    Complete by:  As directed   If you experience chest pain or shortness of breath, CALL 911 and be transported to the hospital emergency room.  If you develope a fever above 101 F, pus (white drainage) or increased drainage or redness at the wound, or calf pain, call your surgeon's office.     Constipation Prevention    Complete by:  As directed   Drink plenty of fluids.  Prune juice may be helpful.  You may use a stool softener, such as Colace (over the counter) 100 mg twice a day.  Use MiraLax (over the counter) for constipation as needed.     Diet - low sodium heart healthy    Complete by:  As directed      Discharge instructions    Complete by:  As directed   Increase activities as comfort allows. Work on bilateral knee range-of-motion and strengthening. Can get current dressings wet daily in the shower. Can start getting actual incisions wet and new dry dressings daily starting 11/30/13. Do take an over-the-counter stool softener twice daily     Discharge patient    Complete by:  As directed      Elevate operative extremity    Complete by:  As directed   Encourage patient to wiggle toes often     Increase activity slowly as tolerated    Complete by:  As directed      Weight bearing as tolerated  Complete by:  As  directed            Follow-up Information   Follow up with Mcarthur Rossetti, MD In 2 weeks.   Specialty:  Orthopedic Surgery   Contact information:   [place] [address] [phone]        Signed: Mcarthur Rossetti 11/27/2013, 7:18 AM

## 2013-11-27 NOTE — PMR Pre-admission (Signed)
PMR Admission Coordinator Pre-Admission Assessment  Patient: Miguel Morris is an 63 y.o., male MRN: [id number] DOB: 09-01-1950 Height: 6' (182.9 cm) Weight: 81.194 kg (179 lb)              Insurance Information HMO:     PPO: yes     PCP:      IPA:      80/20:      OTHER: open access PRIMARY:  [employer]       Policy#:  [id number]      Subscriber: self CM Name:  April Windsor      Phone#:  [phone]     Fax#:  [phone]   Pre-Cert#: auth # [id number]; update due on 12/04/13      Employer:  Benefits:  Phone #: [phone]    Name:  Maree Krabbe. Date:  10/17/13     Deduct:  $1500 individual      Out of Pocket Max:  $4000       Life Max:  n/a CIR:  80%/20%      SNF:  80%/20% Outpatient:       Co-Pay:  $50 copay per visit Home Health:  80%      Co-Pay:  20%  DME:  80%     Co-Pay:  20%  Providers:  In network SECONDARY:       Policy#:       Subscriber:  CM Name:       Phone#:     Fax#:  Pre-Cert#:       Employer:  Benefits:  Phone #:      Name:  Eff. Date:      Deduct:       Out of Pocket Max:       Life Max:  CIR:       SNF:  Outpatient:      Co-Pay:  Home Health:       Co-Pay:  DME:      Co-Pay:   Medicaid Application Date:       Case Manager:  Disability Application Date:       Case Worker:   Emergency Contact Information Contact Information   Name Relation Home Work Mobile   [place] Spouse [phone]  [phone]     Current Medical History  Patient Admitting Diagnosis: Bilateral end stage osteoarthritis in the knee status post total knee replacements 11/24/2011  History of Present Illness: Miguel Morris is a 63 y.o. right-handed male with history of bilateral knee pain secondary to end-stage osteoarthritis and no relief with conservative care. Underwent bilateral total knee arthroplasties 11/23/2013 per Dr. Ninfa Linden. Postoperative pain management. Weightbearing as tolerated bilateral lower extremities. Placed on Xarelto for DVT prophylaxis. Acute blood loss anemia  8.9 and monitored. Physical and occupational therapy evaluations completed 11/24/2013. M.D. has requested physical medicine rehabilitation consult.  Pt. Admitted to IP rehab on 11/27/13        Past Medical History  Past Medical History  Diagnosis Date  . Arthritis     oa and pain both knees  . Heart murmur     STATES BORN WITH HEART MURMUR BUT NO LONGER HAS THE MURMUR  . GERD (gastroesophageal reflux disease)     TUMS EVERY NIGHT  . Prostate enlargement     HX OF ELEVATED PSA - DR. TANNENBAUM IS PT'[S UROLOGIST    Family History  family history is not on file.  Prior Rehab/Hospitalizations: none   Current Medications  Current facility-administered medications:acetaminophen (TYLENOL) suppository 650 mg,  650 mg, Rectal, Q6H PRN, Mcarthur Rossetti, MD;  acetaminophen (TYLENOL) tablet 650 mg, 650 mg, Oral, Q6H PRN, Mcarthur Rossetti, MD, 650 mg at 11/25/13 1319;  alum & mag hydroxide-simeth (MAALOX/MYLANTA) 200-200-20 MG/5ML suspension 30 mL, 30 mL, Oral, Q4H PRN, Mcarthur Rossetti, MD, 30 mL at 11/27/13 1129 diphenhydrAMINE (BENADRYL) 12.5 MG/5ML elixir 12.5-25 mg, 12.5-25 mg, Oral, Q4H PRN, Mcarthur Rossetti, MD;  docusate sodium (COLACE) capsule 100 mg, 100 mg, Oral, BID, Mcarthur Rossetti, MD, 100 mg at 11/27/13 2836;  ferrous sulfate tablet 325 mg, 325 mg, Oral, TID PC, Mcarthur Rossetti, MD, 325 mg at 11/25/13 1710;  FLUoxetine (PROZAC) capsule 20 mg, 20 mg, Oral, Daily, Mcarthur Rossetti, MD, 20 mg at 11/27/13 0951 HYDROmorphone (DILAUDID) injection 1 mg, 1 mg, Intravenous, Q2H PRN, Mcarthur Rossetti, MD, 1 mg at 11/24/13 1634;  menthol-cetylpyridinium (CEPACOL) lozenge 3 mg, 1 lozenge, Oral, PRN, Mcarthur Rossetti, MD;  methocarbamol (ROBAXIN) 500 mg in dextrose 5 % 50 mL IVPB, 500 mg, Intravenous, Q6H PRN, Mcarthur Rossetti, MD;  methocarbamol (ROBAXIN) tablet 500 mg, 500 mg, Oral, Q6H PRN, Mcarthur Rossetti, MD, 500 mg at 11/27/13  6294 metoCLOPramide (REGLAN) injection 5-10 mg, 5-10 mg, Intravenous, Q8H PRN, Mcarthur Rossetti, MD;  metoCLOPramide (REGLAN) tablet 5-10 mg, 5-10 mg, Oral, Q8H PRN, Mcarthur Rossetti, MD;  ondansetron [facility]) injection 4 mg, 4 mg, Intravenous, Q6H PRN, Mcarthur Rossetti, MD;  ondansetron [facility]) tablet 4 mg, 4 mg, Oral, Q6H PRN, Mcarthur Rossetti, MD oxyCODONE (Oxy IR/ROXICODONE) immediate release tablet 5-15 mg, 5-15 mg, Oral, Q3H PRN, Mcarthur Rossetti, MD, 5 mg at 11/27/13 7654;  phenol (CHLORASEPTIC) mouth spray 1 spray, 1 spray, Mouth/Throat, PRN, Mcarthur Rossetti, MD;  polyethylene glycol (MIRALAX / GLYCOLAX) packet 17 g, 17 g, Oral, Daily PRN, Mcarthur Rossetti, MD, 17 g at 11/26/13 1051 rivaroxaban (XARELTO) tablet 10 mg, 10 mg, Oral, Q supper, Minda Ditto, RPH, 10 mg at 11/26/13 1735;  zolpidem (AMBIEN) tablet 5 mg, 5 mg, Oral, QHS PRN,MR X 1, Mcarthur Rossetti, MD, 5 mg at 11/26/13 2211  Patients Current Diet: regular  Precautions / Restrictions Precautions Precautions: Fall;Knee Precaution Comments: removed R KI for ambulation Restrictions Weight Bearing Restrictions: No Other Position/Activity Restrictions: WBAT   Prior Activity Level Community (5-7x/wk): 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, rides Animal nutritionist / [place] Devices/Equipment: Eyeglasses;None Home Equipment: Walker - 2 wheels;Cane - single point;Bedside commode  Prior Functional Level Prior Function Level of Independence: Independent  Current Functional Level Cognition  Overall Cognitive Status: Within Functional Limits for tasks assessed Orientation Level: Oriented X4    Extremity Assessment (includes Sensation/Coordination)          ADLs  Overall ADL's : Needs assistance/impaired Eating/Feeding: Set up Grooming: Set up Upper Body Bathing: Set up;Sitting Lower Body Bathing:  Sit to/from stand;Maximal assistance Upper Body Dressing : Set up;Sitting Lower Body Dressing: Sit to/from stand;Maximal assistance;With adaptive equipment Lower Body Dressing Details (indicate cue type and reason): AE instruction Toileting- Clothing Manipulation and Hygiene: Sit to/from stand;Maximal assistance Toileting - Clothing Manipulation Details (indicate cue type and reason): from elevated surface    Mobility  Overal bed mobility: Needs Assistance Bed Mobility: Sit to Supine Supine to sit: Mod assist Sit to supine: Min assist General bed mobility comments: slight assist for LEs onto bed    Transfers  Overall transfer level:  Needs assistance Equipment used: Rolling walker (2 wheeled) Transfers: Sit to/from Stand Sit to Stand: Mod assist;+2 physical assistance;+2 safety/equipment General transfer comment: verbal cues for UE and LE management as well as weight shifting    Ambulation / Gait / Stairs / Wheelchair Mobility  Ambulation/Gait Ambulation/Gait assistance: Min assist;+2 safety/equipment Ambulation Distance (Feet): 60 Feet Assistive device: Rolling walker (2 wheeled) Gait Pattern/deviations: Step-to pattern Gait velocity: decr General Gait Details: verbal cues for posture    Posture / Balance      Special needs/care consideration BiPAP/CPAP  no CPM  Yes, bilaterally Continuous Drip IV no Dialysis no        Life Vest no Oxygen  no Special Bed  no Trach Size  no Wound Vac (area)  no       Skin  Bilateral total knee incisions, covered with dressings                              Bowel mgmt:  Continent, last BM 11/27/13 Bladder mgmt:  continent Diabetic mgmt no     Previous Home Environment Living Arrangements: Spouse/significant other  Lives With: Spouse Available Help at Discharge: Family Type of Home: House Home Layout: One level Home Access: Stairs to enter Entrance Stairs-Rails: Right Entrance Stairs-Number of Steps: 5 Bathroom Shower/Tub: Clinical cytogeneticist: Standard Bathroom Accessibility: Yes How Accessible: Accessible via walker Home Care Services: No  Discharge Living Setting Plans for Discharge Living Setting: Patient's home Type of Home at Discharge: House Discharge Home Layout: One level Discharge Home Access: Stairs to enter Entrance Stairs-Rails: Right Entrance Stairs-Number of Steps: 5 Discharge Bathroom Shower/Tub: Tub/shower unit Discharge Bathroom Toilet: Standard Discharge Bathroom Accessibility: Yes How Accessible: Accessible via walker Does the patient have any problems obtaining your medications?: No  Social/Family/Support Systems Patient Roles: Spouse Anticipated Caregiver: wife, Kingjames Coury Anticipated Caregiver's Contact Information: [phone] Ability/Limitations of Caregiver: no Caregiver Availability: 24/7 Discharge Plan Discussed with Primary Caregiver: Yes Is Caregiver In Agreement with Plan?: Yes Does Caregiver/Family have Issues with Lodging/Transportation while Pt is in Rehab?: No    Goals/Additional Needs Patient/Family Goal for Rehab: mod I with PT and OT Expected length of stay: 7 days Cultural Considerations: none Equipment Needs: TBD Pt/Family Agrees to Admission and willing to participate: Yes Program Orientation Provided & Reviewed with Pt/Caregiver Including Roles  & Responsibilities: Yes   Decrease burden of Care through IP rehab admission:   no    Possible need for SNF placement upon discharge:  no   Patient Condition: This patient's condition remains as documented in the consult dated 11/26/13 , in which the Rehabilitation Physician determined and documented that the patient's condition is appropriate for intensive rehabilitative care in an inpatient rehabilitation facility. Will admit to inpatient rehab today.  Preadmission Screen Completed By:  [name], PT, 11/27/2013 1:36 PM ______________________________________________________________________    Discussed status with Dr.  Letta Pate on 11/27/13 at  1351 and received telephone approval for admission today.  Admission Coordinator:  Domenick Bookbinder  time 1351 Sudie Grumbling 11/27/13

## 2013-11-27 NOTE — Progress Notes (Signed)
Subjective: 4 Days Post-Op Procedure(s) (LRB): TOTAL KNEE BILATERAL (Bilateral) Patient reports pain as moderate.  H/H stable.  CIR consult yesterday accepted patient pending insurance approval, which is a great plan for his rehab and recovery.  Doing well overall.  Objective: Vital signs in last 24 hours: Temp:  [98.9 F (37.2 C)-99.8 F (37.7 C)] 99.8 F (37.7 C) (08/10 2200) Pulse Rate:  [77-90] 77 (08/10 2200) Resp:  [14-18] 14 (08/11 0432) BP: (117-124)/(70-81) 124/81 mmHg (08/10 2200) SpO2:  [97 %-98 %] 97 % (08/10 2200)  Intake/Output from previous day: 08/10 0701 - 08/11 0700 In: 1380 [P.O.:1380] Out: 675 [Urine:675] Intake/Output this shift:     Recent Labs  11/24/13 0913 11/26/13 0515 11/27/13 0007  HGB 9.7* 8.6* 8.9*    Recent Labs  11/26/13 0515 11/27/13 0007  WBC 6.9 7.0  RBC 3.11* 3.23*  HCT 25.7* 26.8*  PLT 145* 162    Recent Labs  11/24/13 0913  NA 135*  K 3.7  CL 99  CO2 27  BUN 22  CREATININE 1.01  GLUCOSE 172*  CALCIUM 7.8*   No results found for this basename: LABPT, INR,  in the last 72 hours  Sensation intact distally Intact pulses distally Dorsiflexion/Plantar flexion intact Incision: scant drainage No cellulitis present Compartment soft  Assessment/Plan: 4 Days Post-Op Procedure(s) (LRB): TOTAL KNEE BILATERAL (Bilateral) Can discharge to inpatient rehab today.  Sharonica Kraszewski Y 11/27/2013, 7:13 AM

## 2013-11-27 NOTE — Progress Notes (Signed)
Physical Therapy Treatment Patient Details Name: Miguel Morris MRN: [id number] DOB: [birth year] Today's Date: 11/27/2013    History of Present Illness Bil TKR    PT Comments    Pt progressing well with mobility and ambulated in hallway then assisted back to bed for exercises.  Pt requested using bathroom end of session so assisted to bathroom with RN aware pt left on toilet and to call when finished.   Follow Up Recommendations  CIR     Equipment Recommendations  None recommended by PT    Recommendations for Other Services       Precautions / Restrictions Precautions Precautions: Fall;Knee Precaution Comments: removed R KI for ambulation Required Braces or Orthoses: Knee Immobilizer - Right;Knee Immobilizer - Left Knee Immobilizer - Right: Discontinue once straight leg raise with < 10 degree lag Knee Immobilizer - Left: Discontinue once straight leg raise with < 10 degree lag Restrictions Other Position/Activity Restrictions: WBAT    Mobility  Bed Mobility Overal bed mobility: Needs Assistance Bed Mobility: Supine to Sit;Sit to Supine     Supine to sit: Min assist Sit to supine: Min assist   General bed mobility comments: slight assist for LE management  Transfers Overall transfer level: Needs assistance Equipment used: Rolling walker (2 wheeled) Transfers: Sit to/from Stand Sit to Stand: Min assist;From elevated surface         General transfer comment: verbal cues for UE and LE management as well as weight shifting  Ambulation/Gait Ambulation/Gait assistance: Min guard Ambulation Distance (Feet): 100 Feet Assistive device: Rolling walker (2 wheeled) Gait Pattern/deviations: Step-to pattern Gait velocity: decr   General Gait Details: verbal cues for posture   Stairs            Wheelchair Mobility    Modified Rankin (Stroke Patients Only)       Balance                                    Cognition Arousal/Alertness:  Awake/alert Behavior During Therapy: WFL for tasks assessed/performed Overall Cognitive Status: Within Functional Limits for tasks assessed                      Exercises Total Joint Exercises Ankle Circles/Pumps: AROM;Both;10 reps;Supine Quad Sets: AROM;Both;10 reps;Supine Short Arc Quad: AAROM;Both;15 reps;Supine Heel Slides: AAROM;Both;Supine;15 reps Hip ABduction/ADduction: AAROM;Both;15 reps Straight Leg Raises: AAROM;Both;10 reps;Supine    General Comments        Pertinent Vitals/Pain Pain Assessment: 0-10 Pain Score: 3  Pain Location: bilateral knees Pain Descriptors / Indicators: Sore Pain Intervention(s): Limited activity within patient's tolerance;Monitored during session;Repositioned    Home Living                      Prior Function            PT Goals (current goals can now be found in the care plan section) Progress towards PT goals: Progressing toward goals    Frequency  7X/week    PT Plan Current plan remains appropriate    Co-evaluation             End of Session Equipment Utilized During Treatment: Left knee immobilizer;Gait belt Activity Tolerance: Patient tolerated treatment well Patient left: Other (comment) (pt requested being left on toilet for BM, aware to pull cord when finished for assist)     Time: 4034-7425 PT Time Calculation (min): 41 min  Charges:  $Gait Training: 23-37 mins $Therapeutic Exercise: 8-22 mins                    G Codes:      Gaelle Adriance,KATHrine E 12/05/2013, 4:28 PM Carmelia Bake, PT, DPT Dec 05, 2013 Pager: [phone]

## 2013-11-27 NOTE — Progress Notes (Addendum)
Inpatient Rehabilitation  I received insurance authorization for pt. To admit to IP rehab today.  I will make necessary arrangements .  I have notified pt's wife (unable to reach pt.), pt's RN Amy, and  Roselyn Reef SW.  I note that Dr. Ninfa Linden has placed an order for DC to rehab facility in the chart but will contact his office to update him.  Please call if questions.  [facility] Admissions Coordinator Cell [phone] Office [phone]  Addendum:  Message left in the OR for Dr. Ninfa Linden regarding admit to IP rehab today for this patient.

## 2013-11-27 NOTE — Progress Notes (Signed)
Miguel Morris Rehab Admission Coordinator Signed Physical Medicine and Rehabilitation PMR Pre-admission Service date: 11/27/2013 1:36 PM  Related encounter: Admission (Current) from 11/23/2013 in [place]   PMR Admission Coordinator Pre-Admission Assessment  Patient: Miguel Morris is an 63 y.o., male  MRN: [id number]  DOB: 1950/08/27  Height: 6' (182.9 cm)  Weight: 81.194 kg (179 lb)  Insurance Information  HMO: PPO: yes PCP: IPA: 80/20: OTHER: open access  PRIMARY: [employer] Policy#: [id number] Subscriber: self  CM Name: April Windsor Phone#: [phone] ext [phone] Fax#: [phone]  Pre-Cert#: auth # [id number]; update due on 12/04/13 Employer:  Benefits: Phone #: [phone] Name: Maree Krabbe. Date: 10/17/13 Deduct: $1500 individual Out of Pocket Max: $4000 Life Max: n/a  CIR: 80%/20% SNF: 80%/20%  Outpatient: Co-Pay: $50 copay per visit  Home Health: 80% Co-Pay: 20%  DME: 80% Co-Pay: 20%  Providers: In network SECONDARY: Policy#: Subscriber:  CM Name: Phone#: Fax#:  Pre-Cert#: Employer:  Benefits: Phone #: Name:  Eff. Date: Deduct: Out of Pocket Max: Life Max:  CIR: SNF:  Outpatient: Co-Pay:  Home Health: Co-Pay:  DME: Co-Pay:  Medicaid Application Date: Case Manager:  Disability Application Date: Case Worker:  Emergency Contact Information    Contact Information     Name  Relation  Home  Work  Mobile     [place]  Spouse  [phone]   [phone]        Current Medical History  Patient Admitting Diagnosis: Bilateral end stage osteoarthritis in the knee status post total knee replacements 11/24/2011  History of Present Illness: Miguel Morris is a 63 y.o. right-handed male with history of bilateral knee pain secondary to end-stage osteoarthritis and no relief with conservative care. Underwent bilateral total knee arthroplasties 11/23/2013 per Dr. Ninfa Linden. Postoperative pain management. Weightbearing as tolerated bilateral lower  extremities. Placed on Xarelto for DVT prophylaxis. Acute blood loss anemia 8.9 and monitored. Physical and occupational therapy evaluations completed 11/24/2013. M.D. has requested physical medicine rehabilitation consult. Pt. Admitted to IP rehab on 11/27/13    Past Medical History    Past Medical History    Diagnosis  Date    .  Arthritis       oa and pain both knees    .  Heart murmur       STATES BORN WITH HEART MURMUR BUT NO LONGER HAS THE MURMUR    .  GERD (gastroesophageal reflux disease)       TUMS EVERY NIGHT    .  Prostate enlargement       HX OF ELEVATED PSA - DR. TANNENBAUM IS PT'[S UROLOGIST     Family History  family history is not on file.  Prior Rehab/Hospitalizations: none  Current Medications  Current facility-administered medications:acetaminophen (TYLENOL) suppository 650 mg, 650 mg, Rectal, Q6H PRN, Mcarthur Rossetti, MD; acetaminophen (TYLENOL) tablet 650 mg, 650 mg, Oral, Q6H PRN, Mcarthur Rossetti, MD, 650 mg at 11/25/13 1319; alum & mag hydroxide-simeth (MAALOX/MYLANTA) 200-200-20 MG/5ML suspension 30 mL, 30 mL, Oral, Q4H PRN, Mcarthur Rossetti, MD, 30 mL at 11/27/13 1129  diphenhydrAMINE (BENADRYL) 12.5 MG/5ML elixir 12.5-25 mg, 12.5-25 mg, Oral, Q4H PRN, Mcarthur Rossetti, MD; docusate sodium (COLACE) capsule 100 mg, 100 mg, Oral, BID, Mcarthur Rossetti, MD, 100 mg at 11/27/13 3500; ferrous sulfate tablet 325 mg, 325 mg, Oral, TID PC, Mcarthur Rossetti, MD, 325 mg at 11/25/13 1710; FLUoxetine (PROZAC) capsule 20 mg, 20 mg, Oral, Daily, Mcarthur Rossetti, MD, 20 mg at 11/27/13  1607  HYDROmorphone (DILAUDID) injection 1 mg, 1 mg, Intravenous, Q2H PRN, Mcarthur Rossetti, MD, 1 mg at 11/24/13 1634; menthol-cetylpyridinium (CEPACOL) lozenge 3 mg, 1 lozenge, Oral, PRN, Mcarthur Rossetti, MD; methocarbamol (ROBAXIN) 500 mg in dextrose 5 % 50 mL IVPB, 500 mg, Intravenous, Q6H PRN, Mcarthur Rossetti, MD; methocarbamol (ROBAXIN)  tablet 500 mg, 500 mg, Oral, Q6H PRN, Mcarthur Rossetti, MD, 500 mg at 11/27/13 3710  metoCLOPramide (REGLAN) injection 5-10 mg, 5-10 mg, Intravenous, Q8H PRN, Mcarthur Rossetti, MD; metoCLOPramide (REGLAN) tablet 5-10 mg, 5-10 mg, Oral, Q8H PRN, Mcarthur Rossetti, MD; ondansetron [facility]) injection 4 mg, 4 mg, Intravenous, Q6H PRN, Mcarthur Rossetti, MD; ondansetron [facility]) tablet 4 mg, 4 mg, Oral, Q6H PRN, Mcarthur Rossetti, MD  oxyCODONE (Oxy IR/ROXICODONE) immediate release tablet 5-15 mg, 5-15 mg, Oral, Q3H PRN, Mcarthur Rossetti, MD, 5 mg at 11/27/13 6269; phenol (CHLORASEPTIC) mouth spray 1 spray, 1 spray, Mouth/Throat, PRN, Mcarthur Rossetti, MD; polyethylene glycol (MIRALAX / GLYCOLAX) packet 17 g, 17 g, Oral, Daily PRN, Mcarthur Rossetti, MD, 17 g at 11/26/13 1051  rivaroxaban (XARELTO) tablet 10 mg, 10 mg, Oral, Q supper, Minda Ditto, RPH, 10 mg at 11/26/13 1735; zolpidem (AMBIEN) tablet 5 mg, 5 mg, Oral, QHS PRN,MR X 1, Mcarthur Rossetti, MD, 5 mg at 11/26/13 2211  Patients Current Diet: regular  Precautions / Restrictions  Precautions  Precautions: Fall;Knee  Precaution Comments: removed R KI for ambulation  Restrictions  Weight Bearing Restrictions: No  Other Position/Activity Restrictions: WBAT  Prior Activity Level  Community (5-7x/wk): 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, rides Engineer, technical sales / [place] Devices/Equipment: Eyeglasses;None  Home Equipment: Walker - 2 wheels;Cane - single point;Bedside commode  Prior Functional Level  Prior Function  Level of Independence: Independent  Current Functional Level    Cognition  Overall Cognitive Status: Within Functional Limits for tasks assessed  Orientation Level: Oriented X4    Extremity Assessment  (includes Sensation/Coordination)      ADLs  Overall ADL's : Needs assistance/impaired  Eating/Feeding:  Set up  Grooming: Set up  Upper Body Bathing: Set up;Sitting  Lower Body Bathing: Sit to/from stand;Maximal assistance  Upper Body Dressing : Set up;Sitting  Lower Body Dressing: Sit to/from stand;Maximal assistance;With adaptive equipment  Lower Body Dressing Details (indicate cue type and reason): AE instruction  Toileting- Clothing Manipulation and Hygiene: Sit to/from stand;Maximal assistance  Toileting - Clothing Manipulation Details (indicate cue type and reason): from elevated surface    Mobility  Overal bed mobility: Needs Assistance  Bed Mobility: Sit to Supine  Supine to sit: Mod assist  Sit to supine: Min assist  General bed mobility comments: slight assist for LEs onto bed    Transfers  Overall transfer level: Needs assistance  Equipment used: Rolling walker (2 wheeled)  Transfers: Sit to/from Stand  Sit to Stand: Mod assist;+2 physical assistance;+2 safety/equipment  General transfer comment: verbal cues for UE and LE management as well as weight shifting    Ambulation / Gait / Stairs / Wheelchair Mobility  Ambulation/Gait  Ambulation/Gait assistance: Min assist;+2 safety/equipment  Ambulation Distance (Feet): 60 Feet  Assistive device: Rolling walker (2 wheeled)  Gait Pattern/deviations: Step-to pattern  Gait velocity: decr  General Gait Details: verbal cues for posture    Posture / Balance     Special needs/care consideration  BiPAP/CPAP no  CPM Yes, bilaterally  Continuous Drip IV no  Dialysis no  Life Vest no  Oxygen no  Special Bed no  Trach Size no  Wound Vac (area) no  Skin Bilateral total knee incisions, covered with dressings  Bowel mgmt: Continent, last BM 11/27/13  Bladder mgmt: continent  Diabetic mgmt no    Previous Home Environment  Living Arrangements: Spouse/significant other  Lives With: Spouse  Available Help at Discharge: Family  Type of Home: House  Home Layout: One level  Home Access: Stairs to enter  Entrance Stairs-Rails: Right   Entrance Stairs-Number of Steps: 5  Bathroom Shower/Tub: Tourist information centre manager: Standard  Bathroom Accessibility: Yes  How Accessible: Accessible via walker  [place]: No  Discharge Living Setting  Plans for Discharge Living Setting: Patient's home  Type of Home at Discharge: House  Discharge Home Layout: One level  Discharge Home Access: Stairs to enter  Entrance Stairs-Rails: Right  Entrance Stairs-Number of Steps: 5  Discharge Bathroom Shower/Tub: Tub/shower unit  Discharge Bathroom Toilet: Standard  Discharge Bathroom Accessibility: Yes  How Accessible: Accessible via walker  Does the patient have any problems obtaining your medications?: No  Social/Family/Support Systems  Patient Roles: Spouse  Anticipated Caregiver: wife, Tyreque Finken  Anticipated Caregiver's Contact Information: [phone]  Ability/Limitations of Caregiver: no  Caregiver Availability: 24/7  Discharge Plan Discussed with Primary Caregiver: Yes  Is Caregiver In Agreement with Plan?: Yes  Does Caregiver/Family have Issues with Lodging/Transportation while Pt is in Rehab?: No  Goals/Additional Needs  Patient/Family Goal for Rehab: mod I with PT and OT  Expected length of stay: 7 days  Cultural Considerations: none  Equipment Needs: TBD  Pt/Family Agrees to Admission and willing to participate: Yes  Program Orientation Provided & Reviewed with Pt/Caregiver Including Roles & Responsibilities: Yes  Decrease burden of Care through IP rehab admission: no  Possible need for SNF placement upon discharge: no  Patient Condition: This patient's condition remains as documented in the consult dated 11/26/13 , in which the Rehabilitation Physician determined and documented that the patient's condition is appropriate for intensive rehabilitative care in an inpatient rehabilitation facility. Will admit to inpatient rehab today.  Preadmission Screen Completed By: Miguel Morris, PT, 11/27/2013 1:36 PM   ______________________________________________________________________  Discussed status with Dr. Letta Pate on 11/27/13 at 1351 and received telephone approval for admission today.  Admission Coordinator: Domenick Bookbinder time 1351 Sudie Grumbling 11/27/13    Cosigned by: Charlett Blake, MD [11/27/2013 3:46 PM]

## 2013-11-28 ENCOUNTER — Inpatient Hospital Stay (HOSPITAL_COMMUNITY): Payer: Managed Care, Other (non HMO) | Admitting: Physical Therapy

## 2013-11-28 ENCOUNTER — Inpatient Hospital Stay (HOSPITAL_COMMUNITY): Payer: Managed Care, Other (non HMO) | Admitting: Occupational Therapy

## 2013-11-28 DIAGNOSIS — M1710 Unilateral primary osteoarthritis, unspecified knee: Secondary | ICD-10-CM

## 2013-11-28 DIAGNOSIS — Z96659 Presence of unspecified artificial knee joint: Secondary | ICD-10-CM

## 2013-11-28 DIAGNOSIS — I803 Phlebitis and thrombophlebitis of lower extremities, unspecified: Secondary | ICD-10-CM

## 2013-11-28 DIAGNOSIS — IMO0002 Reserved for concepts with insufficient information to code with codable children: Secondary | ICD-10-CM

## 2013-11-28 LAB — COMPREHENSIVE METABOLIC PANEL
ALT: 25 U/L (ref 0–53)
AST: 22 U/L (ref 0–37)
Albumin: 2.4 g/dL — ABNORMAL LOW (ref 3.5–5.2)
Alkaline Phosphatase: 37 U/L — ABNORMAL LOW (ref 39–117)
Anion gap: 10 (ref 5–15)
BUN: 14 mg/dL (ref 6–23)
CALCIUM: 8.2 mg/dL — AB (ref 8.4–10.5)
CO2: 29 mEq/L (ref 19–32)
Chloride: 100 mEq/L (ref 96–112)
Creatinine, Ser: 0.75 mg/dL (ref 0.50–1.35)
GFR calc non Af Amer: >90 mL/min (ref >90)
GLUCOSE: 115 mg/dL — AB (ref 70–99)
Potassium: 4.5 mEq/L (ref 3.7–5.3)
Sodium: 139 mEq/L (ref 137–147)
TOTAL PROTEIN: 5.4 g/dL — AB (ref 6.0–8.3)
Total Bilirubin: 0.7 mg/dL (ref 0.3–1.2)

## 2013-11-28 LAB — CBC WITH DIFFERENTIAL/PLATELET
BASOS ABS: 0 10*3/uL (ref 0.0–0.1)
Basophils Relative: 1 % (ref 0–1)
EOS ABS: 0.2 10*3/uL (ref 0.0–0.7)
EOS PCT: 4 % (ref 0–5)
HCT: 25.4 % — ABNORMAL LOW (ref 39.0–52.0)
Hemoglobin: 8.4 g/dL — ABNORMAL LOW (ref 13.0–17.0)
LYMPHS ABS: 1 10*3/uL (ref 0.7–4.0)
Lymphocytes Relative: 19 % (ref 12–46)
MCH: 27.6 pg (ref 26.0–34.0)
MCHC: 33.1 g/dL (ref 30.0–36.0)
MCV: 83.6 fL (ref 78.0–100.0)
Monocytes Absolute: 0.5 10*3/uL (ref 0.1–1.0)
Monocytes Relative: 10 % (ref 3–12)
Neutro Abs: 3.5 10*3/uL (ref 1.7–7.7)
Neutrophils Relative %: 66 % (ref 43–77)
PLATELETS: 212 10*3/uL (ref 150–400)
RBC: 3.04 MIL/uL — ABNORMAL LOW (ref 4.22–5.81)
RDW: 15.9 % — AB (ref 11.5–15.5)
WBC: 5.3 10*3/uL (ref 4.0–10.5)

## 2013-11-28 MED ORDER — LORATADINE 10 MG PO TABS
10.0000 mg | ORAL_TABLET | Freq: Every day | ORAL | Status: DC
  Administered 2013-11-28 – 2013-12-03 (×6): 10 mg via ORAL
  Filled 2013-11-28 (×7): qty 1

## 2013-11-28 MED ORDER — CAMPHOR-MENTHOL 0.5-0.5 % EX LOTN
TOPICAL_LOTION | Freq: Two times a day (BID) | CUTANEOUS | Status: DC
  Administered 2013-11-28 – 2013-12-03 (×10): via TOPICAL
  Filled 2013-11-28: qty 222

## 2013-11-28 MED ORDER — RIVAROXABAN 15 MG PO TABS
15.0000 mg | ORAL_TABLET | Freq: Two times a day (BID) | ORAL | Status: DC
  Administered 2013-11-28 – 2013-12-03 (×10): 15 mg via ORAL
  Filled 2013-11-28 (×13): qty 1

## 2013-11-28 MED ORDER — NYSTATIN 100000 UNIT/GM EX POWD
Freq: Three times a day (TID) | CUTANEOUS | Status: DC
  Administered 2013-11-29 – 2013-12-03 (×12): via TOPICAL
  Filled 2013-11-28 (×2): qty 15

## 2013-11-28 MED ORDER — RIVAROXABAN 10 MG PO TABS: 10.0000 mg | ORAL_TABLET | Freq: Every day | ORAL | Status: DC

## 2013-11-28 NOTE — Progress Notes (Signed)
Patient information reviewed and entered into eRehab system by Jodilyn Giese, RN, CRRN, PPS Coordinator.  Information including medical coding and functional independence measure will be reviewed and updated through discharge.    

## 2013-11-28 NOTE — Progress Notes (Signed)
CARE MANAGEMENT NOTE 11/28/2013  Patient:  Miguel Morris, Miguel Morris   Account Number:  [id number]  Date Initiated:  11/25/2013  Documentation initiated by:  [facility]  Subjective/Objective Assessment:   TOTAL KNEE BILATERAL (Bilateral)     Action/Plan:   HH vs CIR   Anticipated DC Date:  11/27/2013   Anticipated DC Plan:  IP REHAB FACILITY      DC Planning Services  CM consult      Choice offered to / List presented to:             Status of service:  Completed, signed off Medicare Important Message given?   (If response is "NO", the following Medicare IM given date fields will be blank) Date Medicare IM given:   Medicare IM given by:   Date Additional Medicare IM given:   Additional Medicare IM given by:    Discharge Disposition:  IP REHAB FACILITY  Per UR Regulation:  Reviewed for med. necessity/level of care/duration of stay  If discussed at [place] of Stay Meetings, dates discussed:    Comments:  Verta Ellen

## 2013-11-28 NOTE — H&P (Signed)
Physical Medicine and Rehabilitation Admission H&P  No chief complaint on file.  :  Chief complaint: Knee pain  HPI: Miguel Morris is a 63 y.o. right-handed male with history of bilateral knee pain secondary to end-stage osteoarthritis and no relief with conservative care. Underwent bilateral total knee arthroplasties 11/23/2013 per Dr. Ninfa Linden. Postoperative pain management. Weightbearing as tolerated bilateral lower extremities. Placed on Xarelto for DVT prophylaxis. Acute blood loss anemia 8.9 and monitored. Physical and occupational therapy evaluations completed 11/24/2013. M.D. has requested physical medicine rehabilitation consult  ROS Review of Systems  Gastrointestinal: Positive for constipation.  GERD  Musculoskeletal: Positive for joint pain and myalgias.  Psychiatric/Behavioral: Positive for depression.  All other systems reviewed and are negative    Past Medical History    Diagnosis  Date    .  Arthritis       oa and pain both knees    .  Heart murmur       STATES BORN WITH HEART MURMUR BUT NO LONGER HAS THE MURMUR    .  GERD (gastroesophageal reflux disease)       TUMS EVERY NIGHT    .  Prostate enlargement       HX OF ELEVATED PSA - DR. TANNENBAUM IS PT'[S UROLOGIST       Past Surgical History    Procedure  Laterality  Date    .  Broken clavicle 1988      .  Tonsillectomy        AS A CHILD     History reviewed. No pertinent family history.  Social History: reports that he has quit smoking. He has never used smokeless tobacco. He reports that he drinks alcohol. He reports that he does not use illicit drugs.  Allergies: No Known Allergies    Medications Prior to Admission    Medication  Sig  Dispense  Refill    .  Ascorbic Acid (VITAMIN C PO)  Take 1 tablet by mouth daily.      .  Calcium Carbonate Antacid (TUMS PO)  Take by mouth. EVERY NIGHT      .  FLUoxetine (PROZAC) 20 MG capsule  Take 20 mg by mouth every morning.      Marland Kitchen  GLUCOSAMINE CHONDROITIN COMPLX PO  Take  1 tablet by mouth daily.      .  Omega-3 Fatty Acids (FISH OIL PO)  Take by mouth. 1000 mg daily      .  OVER THE COUNTER MEDICATION  Sal palmetto - 2 pills a day      .  VITAMIN E PO  Take 1 tablet by mouth daily.       Home:  Home Living  Family/patient expects to be discharged to:: Private residence  Living Arrangements: Spouse/significant other  Available Help at Discharge: Family  Type of Home: House  Home Access: Stairs to enter  Technical brewer of Steps: 5  Entrance Stairs-Rails: Right  Home Layout: One [place]: Environmental consultant - 2 wheels;Cane - single point;Bedside commode  Functional History:  Prior Function  Level of Independence: Independent  Functional Status:  Mobility:  Bed Mobility  Overal bed mobility: Needs Assistance  Bed Mobility: Sit to Supine  Supine to sit: Mod assist  Sit to supine: Min assist  General bed mobility comments: slight assist for LEs onto bed  Transfers  Overall transfer level: Needs assistance  Equipment used: Rolling walker (2 wheeled)  Transfers: Sit to/from Stand  Sit to Stand: Mod assist;+2 physical  assistance;+2 safety/equipment  General transfer comment: verbal cues for UE and LE management as well as weight shifting  Ambulation/Gait  Ambulation/Gait assistance: Min assist;+2 safety/equipment  Ambulation Distance (Feet): 60 Feet  Assistive device: Rolling walker (2 wheeled)  Gait Pattern/deviations: Step-to pattern  Gait velocity: decr  General Gait Details: verbal cues for posture   ADL:  ADL  Overall ADL's : Needs assistance/impaired  Eating/Feeding: Set up  Grooming: Set up  Upper Body Bathing: Set up;Sitting  Lower Body Bathing: Sit to/from stand;Maximal assistance  Upper Body Dressing : Set up;Sitting  Lower Body Dressing: Sit to/from stand;Maximal assistance;With adaptive equipment  Lower Body Dressing Details (indicate cue type and reason): AE instruction  Toileting- Clothing Manipulation and Hygiene: Sit  to/from stand;Maximal assistance  Toileting - Clothing Manipulation Details (indicate cue type and reason): from elevated surface  Cognition:  Cognition  Overall Cognitive Status: Within Functional Limits for tasks assessed  Orientation Level: Oriented X4  Cognition  Arousal/Alertness: Awake/alert  Behavior During Therapy: WFL for tasks assessed/performed  Overall Cognitive Status: Within Functional Limits for tasks assessed  Physical Exam:  Blood pressure 124/81, pulse 77, temperature 99.8 F (37.7 C), temperature source Oral, resp. rate 14, height 6' (1.829 m), weight 81.194 kg (179 lb), SpO2 97.00%.  Physical Exam  Constitutional: He is oriented to person, place, and time.  HENT:  Head: Normocephalic.  Eyes: EOM are normal.  Neck: Normal range of motion. Neck supple. No thyromegaly present.  Cardiovascular: Normal rate and regular rhythm.  Respiratory: Effort normal and breath sounds normal. No respiratory distress.  GI: Soft. Bowel sounds are normal. He exhibits no distension.  Neurological: He is alert and oriented to person, place, and time.  Skin:  Bilateral knee incisions dressed  Psychiatric: He has a normal mood and affect    Results for orders placed during the hospital encounter of 11/23/13 (from the past 48 hour(s))    CBC Status: Abnormal     Collection Time     11/26/13 5:15 AM    Result  Value  Ref Range     WBC  6.9  4.0 - 10.5 K/uL     RBC  3.11 (*)  4.22 - 5.81 MIL/uL     Hemoglobin  8.6 (*)  13.0 - 17.0 g/dL     HCT  25.7 (*)  39.0 - 52.0 %     MCV  82.6  78.0 - 100.0 fL     MCH  27.7  26.0 - 34.0 pg     MCHC  33.5  30.0 - 36.0 g/dL     RDW  15.9 (*)  11.5 - 15.5 %     Platelets  145 (*)  150 - 400 K/uL    CBC Status: Abnormal     Collection Time     11/27/13 12:07 AM    Result  Value  Ref Range     WBC  7.0  4.0 - 10.5 K/uL     RBC  3.23 (*)  4.22 - 5.81 MIL/uL     Hemoglobin  8.9 (*)  13.0 - 17.0 g/dL     HCT  26.8 (*)  39.0 - 52.0 %     MCV  83.0   78.0 - 100.0 fL     MCH  27.6  26.0 - 34.0 pg     MCHC  33.2  30.0 - 36.0 g/dL     RDW  15.9 (*)  11.5 - 15.5 %     Platelets  162  150 - 400 K/uL     No results found.  Medical Problem List and Plan:  1. Functional deficits secondary to bilateral total knee arthroplasties 11/23/2013 secondary to end-stage osteoarthritis  2. DVT Prophylaxis/Anticoagulation: Xarelto. Monitor for any bleeding episodes. Check vascular study  3. Pain Management: Oxycodone and Robaxin as needed. Monitor with increased mobility  4. Mood/depression: Prozac 20 mg daily. Provide emotional support  5. Neuropsych: This patient is capable of making decisions on his own behalf.  6. Skin/Wound Care: Routine skin care/monitor bilateral knee incisions  7. Acute blood loss anemia. Continue iron supplement. Followup CBC  8. Constipation. Adjust bowel program. No nausea vomiting  Post Admission Physician Evaluation:  1. Functional deficits secondary to Bilateral End stage OA of knee s/p B TKR 8/7. 2. Patient is admitted to receive collaborative, interdisciplinary care between the physiatrist, rehab nursing staff, and therapy team. 3. Patient's level of medical complexity and substantial therapy needs in context of that medical necessity cannot be provided at a lesser intensity of care such as a SNF. 4. Patient has experienced substantial functional loss from his/her baseline which was documented above under the "Functional History" and "Functional Status" headings. Judging by the patient's diagnosis, physical exam, and functional history, the patient has potential for functional progress which will result in measurable gains while on inpatient rehab. These gains will be of substantial and practical use upon discharge in facilitating mobility and self-care at the household level. 5. Physiatrist will provide 24 hour management of medical needs as well as oversight of the therapy plan/treatment and provide guidance as appropriate  regarding the interaction of the two. 6. 24 hour rehab nursing will assist with bladder management, bowel management, safety, skin/wound care, disease management, medication administration, pain management and patient education and help integrate therapy concepts, techniques,education, etc. 7. PT will assess and treat for/with: pre gait, gait training, endurance , safety, equipment, neuromuscular re education. Goals are: mod I. 8. OT will assess and treat for/with: ADLs, Cognitive perceptual skills, Neuromuscular re education, safety, endurance, equipment. Goals are: mod I. 9. SLP will assess and treat for/with: NA. Goals are: NA. 10. Case Management and Social Worker will assess and treat for psychological issues and discharge planning. 11. Team conference will be held weekly to assess progress toward goals and to determine barriers to discharge. 12. Patient will receive at least 3 hours of therapy per day at least 5 days per week. 13. ELOS: 1 wk  14. Prognosis: excellent  Charlett Blake M.D. [facility] FAAPM&R (Sports Med, Neuromuscular Med) Diplomate Am Board of Electrodiagnostic Med   11/27/2013

## 2013-11-28 NOTE — Progress Notes (Signed)
Admission skin assessment performed with Marlowe Shores, PA. Patient with bilateral knee replacements, surgical dressing intact, Marlowe Shores, PA looked beneath dressing, staples intact. Per Huston Foley to be by to address dressing changes to the knees. Patient has bruising on posterior left leg and bilateral outer ankles, scrotum red.

## 2013-11-28 NOTE — Progress Notes (Signed)
Orthopedic Tech Progress Note Patient Details:  Miguel Morris [birth year] [id number]  CPM Left Knee CPM Left Knee: On Left Knee Flexion (Degrees): 60 Left Knee Extension (Degrees): 0 CPM Right Knee CPM Right Knee: On Right Knee Flexion (Degrees): 60 Right Knee Extension (Degrees): 0   Braulio Bosch 11/28/2013, 11:24 AM

## 2013-11-28 NOTE — Evaluation (Signed)
Physical Therapy Assessment and Plan  Patient Details  Name: Juventino Pavone MRN: [id number] Date of Birth: December 02, 1950  PT Diagnosis: Difficulty walking, Dizziness and giddiness, Edema, Muscle weakness and Pain in joint Rehab Potential: Excellent ELOS: 5 days   Today's Date: 11/28/2013 Time: 6712-4580 Time Calculation (min): 75 min  Problem List:  Patient Active Problem List   Diagnosis Date Noted  . Status post bilateral knee replacements 11/27/2013  . Degenerative arthritis of knee, bilateral 11/23/2013  . Status post total bilateral knee replacement 11/23/2013    Past Medical History:  Past Medical History  Diagnosis Date  . Arthritis     oa and pain both knees  . Heart murmur     STATES BORN WITH HEART MURMUR BUT NO LONGER HAS THE MURMUR  . GERD (gastroesophageal reflux disease)     TUMS EVERY NIGHT  . Prostate enlargement     HX OF ELEVATED PSA - DR. TANNENBAUM IS PT'[S UROLOGIST   Past Surgical History:  Past Surgical History  Procedure Laterality Date  . Broken clavicle 1988    . Tonsillectomy      AS A CHILD  . Total knee arthroplasty Bilateral 11/23/2013    Procedure: TOTAL KNEE BILATERAL;  Surgeon: Mcarthur Rossetti, MD;  Location: [facility];  Service: Orthopedics;  Laterality: Bilateral;    Assessment & Plan Clinical Impression: Priscilla Finklea is a 63 y.o. right-handed male with history of bilateral knee pain secondary to end-stage osteoarthritis and no relief with conservative care. Underwent bilateral total knee arthroplasties 11/23/2013 per Dr. Ninfa Linden. Postoperative pain management. Weightbearing as tolerated bilateral lower extremities. Placed on Xarelto for DVT prophylaxis. Acute blood loss anemia 8.9 and monitored.   Patient transferred to CIR on 11/27/2013 .   Patient currently requires mod with mobility secondary to muscle weakness and muscle joint tightness, decreased cardiorespiratoy endurance, decreased coordination and decreased standing balance.   Prior to hospitalization, patient was independent  with mobility and lived with Spouse in a House home.  Home access is 5Stairs to enter.  Patient will benefit from skilled PT intervention to maximize safe functional mobility, minimize fall risk and decrease caregiver burden for planned discharge home with 24 hour assist.  Anticipate patient will benefit from follow up OP at discharge.     Skilled Therapeutic Intervention PT Evaluation: PT completes manual muscle testing, sensation assessment, functional mobility assessment, and balance testing.   Therapeutic Activity: Pt requests to use the bathroom and req CGA with grab bar to complete toilet transfer - pt self manages clothes and hygiene. Pt req verbal cues for safe sit and safe use of AD.   W/C Management: PT instructs pt in w/c propulsion x 400' with B UE req SBA and verbal cues for safety - pt demonstrates impulsiveness with driving w/c and req cues to slow down.   Therapeutic Exercise: PT instructs pt in bed level exercises: ankle pumps x 20, supine hip abduction/adduction x 20, heel slides AROM x 20. R knee flexion achieves 55 degrees and L knee flexion achieves 62 degrees.   Pt is very motivated to get better and d/c home. Pt demonstrates fair knee extension, but significantly limited knee flexion. Pain appears well controlled by meds. Pt will benefit from continued progressive mobility training with focus on restoring knee ROM.    PT Evaluation Precautions/Restrictions Precautions Precautions: Fall;Knee Precaution Comments: removed R KI for ambulation Required Braces or Orthoses: Knee Immobilizer - Right;Knee Immobilizer - Left Knee Immobilizer - Right: Discontinue once straight leg raise with <  10 degree lag Knee Immobilizer - Left: Discontinue once straight leg raise with < 10 degree lag Restrictions Weight Bearing Restrictions: Yes Other Position/Activity Restrictions: WBAT General Chart Reviewed: Yes Family/Caregiver  Present: No Vital SignsTherapy Vitals Pulse Rate: 76 BP: 145/83 mmHg Patient Position (if appropriate): Sitting Oxygen Therapy SpO2: 100 % O2 Device: None (Room air) Pain Pain Assessment Pain Assessment: 0-10 Pain Score: 3  Pain Type: Surgical pain Pain Location: Knee Pain Orientation: Right;Left Pain Descriptors / Indicators: Aching;Sore Pain Frequency: Intermittent Pain Onset: On-going Patients Stated Pain Goal: 2 Pain Intervention(s): Rest Multiple Pain Sites: No Home Living/Prior Functioning Home Living Available Help at Discharge: Family Type of Home: House Home Access: Stairs to enter Technical brewer of Steps: 5 Entrance Stairs-Rails: Right Home Layout: One level  Lives With: Spouse     Cognition Overall Cognitive Status: Within Functional Limits for tasks assessed Arousal/Alertness: Awake/alert Orientation Level: Oriented X4 Attention: Focused;Sustained;Alternating;Selective Focused Attention: Appears intact Sustained Attention: Appears intact Selective Attention: Appears intact Alternating Attention: Appears intact Memory: Appears intact Awareness: Appears intact Problem Solving: Appears intact Safety/Judgment: Appears intact Sensation Sensation Light Touch: Appears Intact Proprioception: Appears Intact Coordination Gross Motor Movements are Fluid and Coordinated: No Fine Motor Movements are Fluid and Coordinated: Yes Coordination and Movement Description: stiff knees due to B TKAs Motor  Motor Motor: Within Functional Limits Motor - Skilled Clinical Observations: biltateral LE weakness  Mobility Bed Mobility Bed Mobility: Supine to Sit;Sit to Supine Supine to Sit: 5: Supervision Supine to Sit Details: Visual cues/gestures for sequencing Supine to Sit Details (indicate cue type and reason): hand placement Sit to Supine: 3: Mod assist Sit to Supine - Details: Manual facilitation for placement Sit to Supine - Details (indicate cue type and  reason): B LE assist Transfers Transfers: Yes Sit to Stand: 4: Min assist Sit to Stand Details: Manual facilitation for weight shifting;Verbal cues for technique Sit to Stand Details (indicate cue type and reason): hand placement, pushing up off of armrests, scooting feet underneath self, transferring hands to AD Stand Pivot Transfers: 4: Min Mudlogger: Yes Wheelchair Assistance: 5: Investment banker, operational Details: Verbal cues for Information systems manager: Both upper extremities Wheelchair Parts Management: Needs assistance Distance: 400  Trunk/Postural Assessment  Cervical Assessment Cervical Assessment: Within Functional Limits Thoracic Assessment Thoracic Assessment: Within Functional Limits Lumbar Assessment Lumbar Assessment: Within Functional Limits Postural Control Postural Control: Within Functional Limits  Balance Balance Balance Assessed: Yes Static Sitting Balance Static Sitting - Balance Support: Feet unsupported;No upper extremity supported Static Sitting - Level of Assistance: 6: Modified independent (Device/Increase time) Dynamic Sitting Balance Dynamic Sitting - Balance Support: Feet unsupported;During functional activity;No upper extremity supported Dynamic Sitting - Level of Assistance: 5: Stand by assistance Static Standing Balance Static Standing - Balance Support: During functional activity Static Standing - Level of Assistance: 4: Min assist Dynamic Standing Balance Dynamic Standing - Balance Support: During functional activity Dynamic Standing - Level of Assistance: 4: Min assist;3: Mod assist Dynamic Standing - Balance Activities: Lateral lean/weight shifting Extremity Assessment  RUE Assessment RUE Assessment: Within Functional Limits LUE Assessment LUE Assessment: Within Functional Limits RLE Assessment RLE Assessment: Exceptions to [facility] RLE AROM (degrees) Right Knee  Extension: 12 Right Knee Flexion: 55 RLE PROM (degrees) Right Knee Extension: 9 Right Knee Flexion: 63 RLE Strength RLE Overall Strength: Deficits;Due to pain LLE Assessment LLE Assessment: Exceptions to WFL LLE AROM (degrees) Left Knee Extension: 7 Left Knee Flexion: 62 LLE PROM (degrees) Left Knee Extension: 5  Left Knee Flexion: 73 LLE Strength LLE Overall Strength: Deficits;Due to pain  FIM:  FIM - Control and instrumentation engineer Devices: Walker;Arm rests Bed/Chair Transfer: 5: Supine > Sit: Supervision (verbal cues/safety issues);3: Sit > Supine: Mod A (lifting assist/Pt. 50-74%/lift 2 legs);4: Bed > Chair or W/C: Min A (steadying Pt. > 75%);4: Chair or W/C > Bed: Min A (steadying Pt. > 75%) FIM - Locomotion: Wheelchair Distance: 400 Locomotion: Wheelchair: 5: Travels 150 ft or more: maneuvers on rugs and over door sills with supervision, cueing or coaxing   Refer to Care Plan for Long Term Goals  Recommendations for other services: None  Discharge Criteria: Patient will be discharged from PT if patient refuses treatment 3 consecutive times without medical reason, if treatment goals not met, if there is a change in medical status, if patient makes no progress towards goals or if patient is discharged from hospital.  The above assessment, treatment plan, treatment alternatives and goals were discussed and mutually agreed upon: by patient and by family  [facility] M 11/28/2013, 10:53 AM

## 2013-11-28 NOTE — Progress Notes (Signed)
VASCULAR LAB PRELIMINARY  PRELIMINARY  PRELIMINARY  PRELIMINARY  Bilateral lower extremity venous duplex  completed.    Preliminary report:  Right:  DVT noted in the PTV.  No evidence of superficial thrombosis.  No Baker's cyst.  Left: DVT noted in the PTV and a soleal vein.  No evidence of superficial thrombosis.  No Baker's cyst.   Hisae Decoursey, RVT 11/28/2013, 6:08 PM

## 2013-11-28 NOTE — Progress Notes (Signed)
RN/Patient reports that dopplers were positive for BLE DVT. Xarelto increased to treatment doses. Results/SE discussed with patient and wife.

## 2013-11-28 NOTE — Progress Notes (Signed)
Physical Therapy Session Note  Patient Details  Name: Miguel Morris MRN: [id number] Date of Birth: 1951/04/11  Today's Date: 11/28/2013 Time: 1400-1500 Time Calculation (min): 60 min  Short Term Goals: Week 1:     Skilled Therapeutic Interventions/Progress Updates:    Therapeutic Activity: Pt completes supine to sit mod I without bedrail, heavily relying upon B UEs. Pt completes sit to stand in compensated manner with RW req close SBA. Pt completes toilet transfer with RW and B knee braces on req SBA and heavily reliance on UEs during stand to sit.   Therapeutic Exercise: PT instructs pt in AAROM heel slides with leg lifter self assisting x 10 reps to B LEs. R knee flexion achieves 74 degrees at best. L knee flexion achieves 80 degrees at best.   Gait Training: PT instructs pt in ambulation with RW x 200' req CGA-SBA with B knee braces on and verbal cues to tuck bottom and for upright posture - heavy reliance on arms.  PT instructs pt in ascending/descending 3 low steps with B rails req CGA and verbal cues for technique. PT instructs pt in ascending/descending 1 standard height stair with B rails req min A for weight shifting and verbal cues for technique.   Pt is very strong and demonstrates good compensatory technique for mobility with his stiff knees. Pt's main limiting factor is ability to do stairs with 1 rail safely in order to enter/exit home. Pt will benefit from continued knee ROM with focus on flexion, safety with transfers, and stair training towards safety in going home.   Therapy Documentation Precautions:  Precautions Precautions: Fall;Knee Precaution Comments: removed R KI for ambulation Required Braces or Orthoses: Knee Immobilizer - Right;Knee Immobilizer - Left Knee Immobilizer - Right: Discontinue once straight leg raise with < 10 degree lag Knee Immobilizer - Left: Discontinue once straight leg raise with < 10 degree lag Restrictions Weight Bearing Restrictions:  Yes Other Position/Activity Restrictions: WBAT    Pain: Pain Assessment Pain Assessment: 0-10 Pain Score: 3  Pain Type: Surgical pain Pain Location: Knee Pain Orientation: Right;Left Pain Descriptors / Indicators: Aching Pain Onset: On-going Pain Intervention(s): Medication (See eMAR)    See FIM for current functional status  Therapy/Group: Individual Therapy  Eva Griffo M 11/28/2013, 2:18 PM

## 2013-11-28 NOTE — Evaluation (Signed)
Occupational Therapy Assessment and Plan  Patient Details  Name: Miguel Morris MRN: [id number] Date of Birth: 03-23-1951  OT Diagnosis: acute pain and muscle weakness (generalized) Rehab Potential: Rehab Potential: Good ELOS: 7 days   Today's Date: 11/28/2013 Time: 0800-0900 Time Calculation (min): 60 min  Problem List:  Patient Active Problem List   Diagnosis Date Noted  . Status post bilateral knee replacements 11/27/2013  . Degenerative arthritis of knee, bilateral 11/23/2013  . Status post total bilateral knee replacement 11/23/2013    Past Medical History:  Past Medical History  Diagnosis Date  . Arthritis     oa and pain both knees  . Heart murmur     STATES BORN WITH HEART MURMUR BUT NO LONGER HAS THE MURMUR  . GERD (gastroesophageal reflux disease)     TUMS EVERY NIGHT  . Prostate enlargement     HX OF ELEVATED PSA - DR. TANNENBAUM IS PT'[S UROLOGIST   Past Surgical History:  Past Surgical History  Procedure Laterality Date  . Broken clavicle 1988    . Tonsillectomy      AS A CHILD  . Total knee arthroplasty Bilateral 11/23/2013    Procedure: TOTAL KNEE BILATERAL;  Surgeon: Mcarthur Rossetti, MD;  Location: [facility];  Service: Orthopedics;  Laterality: Bilateral;    Assessment & Plan Clinical Impression: Patient is a 63 y.o. year old male right-handed male with history of bilateral knee pain secondary to end-stage osteoarthritis and no relief with conservative care. Underwent bilateral total knee arthroplasties 11/23/2013 per Dr. Ninfa Linden. Postoperative pain management. Weightbearing as tolerated bilateral lower extremities. Placed on Xarelto for DVT prophylaxis. Acute blood loss anemia 8.9 and monitored.   Patient transferred to CIR on 11/27/2013 .    Patient currently requires min a for basic mobility and max to total A for LB dressing sit to satnd with basic self-care skills secondary to muscle weakness and LE edema, decr activity tolerance and decreased  standing balance and decreased balance strategies.  Prior to hospitalization, patient could complete ADL with independent .  Patient will benefit from skilled intervention to decrease level of assist with basic self-care skills and increase independence with basic self-care skills prior to discharge home with care partner.  Anticipate patient will require intermittent A wtih Iadl prn and no further OT follow recommended.  OT - End of Session Activity Tolerance: Tolerates 30+ min activity with multiple rests OT Assessment Rehab Potential: Good OT Patient demonstrates impairments in the following area(s): Balance;Edema;Endurance;Motor;Pain OT Basic ADL's Functional Problem(s): Bathing;Dressing;Toileting OT Advanced ADL's Functional Problem(s): Simple Meal Preparation OT Transfers Functional Problem(s): Toilet;Tub/Shower OT Additional Impairment(s): None OT Plan OT Intensity: Minimum of 1-2 x/day, 45 to 90 minutes OT Frequency: 5 out of 7 days OT Duration/Estimated Length of Stay: 7 days OT Treatment/Interventions: Medical illustrator training;Community reintegration;Discharge planning;Disease mangement/prevention;Pain management;Self Care/advanced ADL retraining;Psychosocial support;Patient/family education;Therapeutic Activities;Therapeutic Exercise;UE/LE Strength taining/ROM OT Self Feeding Anticipated Outcome(s): no goal OT Basic Self-Care Anticipated Outcome(s): mod I  OT Toileting Anticipated Outcome(s): mod I  OT Bathroom Transfers Anticipated Outcome(s): mod I  OT Recommendation Patient destination: Home Follow Up Recommendations: None Equipment Recommended: Other (comment) Equipment Details: Pt reports having access to 3:1 and shower seat for home   Skilled Therapeutic Intervention  1:1 Ot eval initiated with Ot goals, purpose and roled discussed. Self care retraining at shower level with bilateral bandages covered.  Focus on bed mobility, sit to stands, standing balance, knee  flexion with sit to stands, functional ambulation with left KI on around room,  tolerance to standing for grooming tasks and LB dressing sitting EOB. Pt unable to flex knees underneath of himself to bend forward to thread pants at this time with increased ROM however pt with more success.  Pt required A to bring both LEs in and out of bed due to weakness. Pt left in bed to rest before PT session. Discussed not getting up alone and calling for A with needed.    OT Evaluation Precautions/Restrictions  Precautions Precautions: Fall;Knee Precaution Comments: removed R KI for ambulation Required Braces or Orthoses: Knee Immobilizer - Right;Knee Immobilizer - Left Knee Immobilizer - Right: Discontinue once straight leg raise with < 10 degree lag Knee Immobilizer - Left: Discontinue once straight leg raise with < 10 degree lag Restrictions Weight Bearing Restrictions: Yes Other Position/Activity Restrictions: WBAT General Chart Reviewed: Yes Family/Caregiver Present: No Vital Signs Therapy Vitals Pulse Rate: 76 BP: 145/83 mmHg Patient Position (if appropriate): Sitting Oxygen Therapy SpO2: 100 % O2 Device: None (Room air) Pain Pain Assessment Pain Assessment: 0-10 Pain Score: 3  Pain Type: Surgical pain Pain Location: Knee Pain Orientation: Right;Left Pain Descriptors / Indicators: Aching;Sore Pain Frequency: Intermittent Pain Onset: On-going Patients Stated Pain Goal: 2 Pain Intervention(s): Rest Multiple Pain Sites: No Home Living/Prior Functioning Home Living Available Help at Discharge: Family Type of Home: House Home Access: Stairs to enter Technical brewer of Steps: 5 Entrance Stairs-Rails: Right Home Layout: One level  Lives With: Spouse ADL ADL ADL Comments: see FIM Vision/Perception  Vision- History Baseline Vision/History: Wears glasses Patient Visual Report: No change from baseline Vision- Assessment Vision Assessment?: No apparent visual deficits   Cognition Overall Cognitive Status: Within Functional Limits for tasks assessed Arousal/Alertness: Awake/alert Orientation Level: Oriented X4 Attention: Focused;Sustained;Alternating;Selective Focused Attention: Appears intact Sustained Attention: Appears intact Selective Attention: Appears intact Alternating Attention: Appears intact Memory: Appears intact Awareness: Appears intact Problem Solving: Appears intact Safety/Judgment: Appears intact Sensation Sensation Light Touch: Appears Intact Proprioception: Appears Intact Coordination Gross Motor Movements are Fluid and Coordinated: No Fine Motor Movements are Fluid and Coordinated: Yes Coordination and Movement Description: stiff knees due to B TKAs Motor  Motor Motor: Within Functional Limits Motor - Skilled Clinical Observations: biltateral LE weakness Mobility  Bed Mobility Bed Mobility: Supine to Sit Supine to Sit: 5: Supervision Transfers Transfers: Sit to Stand;Stand to Sit Sit to Stand: 3: Mod assist Stand to Sit: 3: Mod assist  Trunk/Postural Assessment  Cervical Assessment Cervical Assessment: Within Functional Limits Thoracic Assessment Thoracic Assessment: Within Functional Limits Lumbar Assessment Lumbar Assessment: Within Functional Limits Postural Control Postural Control: Within Functional Limits  Balance Balance Balance Assessed: Yes Static Sitting Balance Static Sitting - Balance Support: Feet unsupported;No upper extremity supported Static Sitting - Level of Assistance: 6: Modified independent (Device/Increase time) Dynamic Sitting Balance Dynamic Sitting - Balance Support: Feet unsupported;During functional activity;No upper extremity supported Dynamic Sitting - Level of Assistance: 5: Stand by assistance Static Standing Balance Static Standing - Balance Support: During functional activity Static Standing - Level of Assistance: 4: Min assist Dynamic Standing Balance Dynamic Standing -  Balance Support: During functional activity Dynamic Standing - Level of Assistance: 4: Min assist;3: Mod assist Extremity/Trunk Assessment RUE Assessment RUE Assessment: Within Functional Limits LUE Assessment LUE Assessment: Within Functional Limits  FIM:  FIM - Eating Eating Activity: 7: Complete independence:no helper FIM - Grooming Grooming: 4: Steadying assist  or patient completes 3 of 4 or 4 of 5 steps (in standing) FIM - Bathing Bathing Steps Patient Completed: Chest;Right Arm;Left Arm;Abdomen;Front perineal area;Right  upper leg;Left upper leg Bathing: 3: Mod-Patient completes 5-7 104f10 parts or 50-74% FIM - Upper Body Dressing/Undressing Upper body dressing/undressing steps patient completed: Put head through opening of pull over shirt/dress;Thread/unthread left sleeve of pullover shirt/dress;Thread/unthread right sleeve of pullover shirt/dresss;Pull shirt over trunk Upper body dressing/undressing: 5: Set-up assist to: Obtain clothing/put away FIM - Lower Body Dressing/Undressing Lower body dressing/undressing: 1: Total-Patient completed less than 25% of tasks FIM - Bed/Chair Transfer Bed/Chair Transfer: 5: Supine > Sit: Supervision (verbal cues/safety issues);3: Bed > Chair or W/C: Mod A (lift or lower assist);3: Chair or W/C > Bed: Mod A (lift or lower assist) (A need for both LEs in and out of bed) FIM - TRadio producerDevices: WEnvironmental consultantBedside commode Toilet Transfers: 4-To toilet/BSC: Min A (steadying Pt. > 75%);4-From toilet/BSC: Min A (steadying Pt. > 75%) FIM - Tub/Shower Transfers Tub/Shower Assistive Devices: Walk in shower (zero entry shower) Tub/shower Transfers: 4-Out of Tub/Shower: Min A (steadying Pt. > 75%/lift 1 leg);4-Into Tub/Shower: Min A (steadying Pt. > 75%/lift 1 leg)   Refer to Care Plan for Long Term Goals  Recommendations for other services: None  Discharge Criteria: Patient will be discharged from OT if patient refuses  treatment 3 consecutive times without medical reason, if treatment goals not met, if there is a change in medical status, if patient makes no progress towards goals or if patient is discharged from hospital.  The above assessment, treatment plan, treatment alternatives and goals were discussed and mutually agreed upon: by patient  SNicoletta Ba8/03/2014, 10:21 AM

## 2013-11-29 ENCOUNTER — Inpatient Hospital Stay (HOSPITAL_COMMUNITY): Payer: Managed Care, Other (non HMO)

## 2013-11-29 ENCOUNTER — Inpatient Hospital Stay (HOSPITAL_COMMUNITY): Payer: Managed Care, Other (non HMO) | Admitting: Physical Therapy

## 2013-11-29 DIAGNOSIS — Z96659 Presence of unspecified artificial knee joint: Secondary | ICD-10-CM

## 2013-11-29 DIAGNOSIS — M1710 Unilateral primary osteoarthritis, unspecified knee: Secondary | ICD-10-CM

## 2013-11-29 DIAGNOSIS — IMO0002 Reserved for concepts with insufficient information to code with codable children: Secondary | ICD-10-CM

## 2013-11-29 MED ORDER — RIVAROXABAN 20 MG PO TABS: 20.0000 mg | ORAL_TABLET | Freq: Every day | ORAL | Status: DC

## 2013-11-29 NOTE — Progress Notes (Signed)
Physical Therapy Session Note  Patient Details  Name: Miguel Morris MRN: [id number] Date of Birth: 1950/07/25  Today's Date: 11/29/2013 Time: 2563-8937 Time Calculation (min): 61 min  Short Term Goals: Week 1:  PT Short Term Goal 1 (Week 1): STGs = LTGs due to LOS  Skilled Therapeutic Interventions/Progress Updates:    Pt received semi-reclined in bed; agreeable to therapy. Pt reporting feeling "woozy" with semi-reclined>sit and sit>stand. Vital signs in seated: BP 139/88, HR 65, SpO2 100%; in standing: BP 126.114, HR 82, SpO2 81% (on RA) with onset of pt-reported lightheadedness. Upon returning to seated position, SpO2 increased to 92% and lightheadedness subsided. RN notified of SpO2 and was present soon after with 2 L/min supplemental O2 via [place]. Supplemental O2 worn for remainder of session with SpO2 >98 and no lightheadedness.  Pt ambulated 2x15' (to/from bathroom) with rolling walker and supervision. Pt then ambulated to sink with rolling walker and performed standing without UE support with supervision for >3 minutes while washing hands and brushing teeth. Without seated rest break, transitioned to gait x96' in controlled environment with rolling walker and supervision; no overt LOB. Gait trial ended due to pt fatigue.  Pt continues to demonstrate 10-15 degree knee extensor lag with L straight leg raise. Educated pt on recommendation to continue to wear L knee immobilizer with mobility unless otherwise specified by orthopedist. Pt verbalized understanding. Explained, demonstrated isometric quad sets with effective return demonstration. Instructed pt to performed between therapy sessions (with focus on L knee) to achieve full knee extension ROM. Departed with pt semi-reclined in bed in no apparent distress on 2 L/min supplemental O2, [place].  Therapy Documentation Precautions:  Precautions Precautions: Fall;Knee Precaution Comments: removed R KI for ambulation Required Braces or Orthoses:  Knee Immobilizer - Right;Knee Immobilizer - Left Knee Immobilizer - Right: Discontinue once straight leg raise with < 10 degree lag Knee Immobilizer - Left: Discontinue once straight leg raise with < 10 degree lag Restrictions Weight Bearing Restrictions: Yes Other Position/Activity Restrictions: WBAT Vital Signs: Therapy Vitals Pulse Rate: 82 BP: 126/114 mmHg Patient Position (if appropriate): Standing (Pt reporting lightheadedness) Oxygen Therapy SpO2: 81 % (Increased to 92% upon sitting) O2 Device: None (Room air) Pain:  Pt reported 2/10 discomfort in bilateral knees which has been ongoing since surgery. Pain decreased with ambulation then with elevating bilat LE's. Locomotion : Ambulation Ambulation/Gait Assistance: 5: Supervision   See FIM for current functional status  Therapy/Group: Individual Therapy  Hobble, Malva Cogan 11/29/2013, 1:52 PM

## 2013-11-29 NOTE — Progress Notes (Signed)
Subjective/Complaints: Some calf soreness No CP or SOB Discussed doppler results at length  Objective: Vital Signs: Blood pressure 138/71, pulse 65, temperature 98.2 F (36.8 C), temperature source Oral, resp. rate 18, height 6' (1.829 m), weight 83.1 kg (183 lb 3.2 oz), SpO2 97.00%. No results found. Results for orders placed during the hospital encounter of 11/27/13 (from the past 72 hour(s))  CBC WITH DIFFERENTIAL     Status: Abnormal   Collection Time    11/28/13  6:28 AM      Result Value Ref Range   WBC 5.3  4.0 - 10.5 K/uL   RBC 3.04 (*) 4.22 - 5.81 MIL/uL   Hemoglobin 8.4 (*) 13.0 - 17.0 g/dL   HCT 48.2 (*) 70.7 - 86.7 %   MCV 83.6  78.0 - 100.0 fL   MCH 27.6  26.0 - 34.0 pg   MCHC 33.1  30.0 - 36.0 g/dL   RDW 54.4 (*) 92.0 - 10.0 %   Platelets 212  150 - 400 K/uL   Neutrophils Relative % 66  43 - 77 %   Neutro Abs 3.5  1.7 - 7.7 K/uL   Lymphocytes Relative 19  12 - 46 %   Lymphs Abs 1.0  0.7 - 4.0 K/uL   Monocytes Relative 10  3 - 12 %   Monocytes Absolute 0.5  0.1 - 1.0 K/uL   Eosinophils Relative 4  0 - 5 %   Eosinophils Absolute 0.2  0.0 - 0.7 K/uL   Basophils Relative 1  0 - 1 %   Basophils Absolute 0.0  0.0 - 0.1 K/uL  COMPREHENSIVE METABOLIC PANEL     Status: Abnormal   Collection Time    11/28/13  6:28 AM      Result Value Ref Range   Sodium 139  137 - 147 mEq/L   Potassium 4.5  3.7 - 5.3 mEq/L   Chloride 100  96 - 112 mEq/L   CO2 29  19 - 32 mEq/L   Glucose, Bld 115 (*) 70 - 99 mg/dL   BUN 14  6 - 23 mg/dL   Creatinine, Ser 7.12  0.50 - 1.35 mg/dL   Calcium 8.2 (*) 8.4 - 10.5 mg/dL   Total Protein 5.4 (*) 6.0 - 8.3 g/dL   Albumin 2.4 (*) 3.5 - 5.2 g/dL   AST 22  0 - 37 U/L   ALT 25  0 - 53 U/L   Alkaline Phosphatase 37 (*) 39 - 117 U/L   Total Bilirubin 0.7  0.3 - 1.2 mg/dL   GFR calc non Af Amer >90  >90 mL/min   GFR calc Af Amer >90  >90 mL/min   Comment: (NOTE)     The eGFR has been calculated using the CKD EPI equation.     This calculation  has not been validated in all clinical situations.     eGFR's persistently <90 mL/min signify possible Chronic Kidney     Disease.   Anion gap 10  5 - 15     HEENT: normal Cardio: RRR and No Murmur Resp: CTA B/L and unlabored GI: BS positive and NT, ND Extremity:  Pulses positive and No Edema Skin:   Wound C/D/I Neuro: Alert/Oriented Musc/Skel:  Extremity tender mild calf tenderness and Swelling bilateral LEs Gen NAD   Assessment/Plan: 1. Functional deficits secondary to Bilateral Knee OA end stage s/p TKR which require 3+ hours per day of interdisciplinary therapy in a comprehensive inpatient rehab setting. Physiatrist is providing close  team supervision and 24 hour management of active medical problems listed below. Physiatrist and rehab team continue to assess barriers to discharge/monitor patient progress toward functional and medical goals. FIM: FIM - Bathing Bathing Steps Patient Completed: Chest;Right Arm;Left Arm;Abdomen;Front perineal area;Right upper leg;Left upper leg Bathing: 3: Mod-Patient completes 5-7 53f 10 parts or 50-74%  FIM - Upper Body Dressing/Undressing Upper body dressing/undressing steps patient completed: Put head through opening of pull over shirt/dress;Thread/unthread left sleeve of pullover shirt/dress;Thread/unthread right sleeve of pullover shirt/dresss;Pull shirt over trunk Upper body dressing/undressing: 5: Set-up assist to: Obtain clothing/put away FIM - Lower Body Dressing/Undressing Lower body dressing/undressing: 1: Total-Patient completed less than 25% of tasks  FIM - Toileting Toileting steps completed by patient: Adjust clothing prior to toileting;Adjust clothing after toileting Toileting Assistive Devices: Grab bar or rail for support Toileting: 4: Steadying assist  FIM - Radio producer Devices: Bedside commode;Grab bars Toilet Transfers: 4-To toilet/BSC: Min A (steadying Pt. > 75%)  FIM - Financial trader Devices: Walker;Arm rests Bed/Chair Transfer: 5: Supine > Sit: Supervision (verbal cues/safety issues);3: Sit > Supine: Mod A (lifting assist/Pt. 50-74%/lift 2 legs);4: Bed > Chair or W/C: Min A (steadying Pt. > 75%);4: Chair or W/C > Bed: Min A (steadying Pt. > 75%)  FIM - Locomotion: Wheelchair Distance: 400 Locomotion: Wheelchair: 5: Travels 150 ft or more: maneuvers on rugs and over door sills with supervision, cueing or coaxing FIM - Locomotion: Ambulation Locomotion: Ambulation Assistive Devices: Walker - Rolling;Orthosis Ambulation/Gait Assistance: 4: Min guard;5: Supervision Locomotion: Ambulation: 4: Travels 150 ft or more with minimal assistance (Pt.>75%)  Comprehension Comprehension Mode: Auditory Comprehension: 7-Follows complex conversation/direction: With no assist  Expression Expression Mode: Verbal Expression: 7-Expresses complex ideas: With no assist  Social Interaction Social Interaction: 6-Interacts appropriately with others with medication or extra time (anti-anxiety, antidepressant).  Problem Solving Problem Solving: 7-Solves complex problems: Recognizes & self-corrects  Memory Memory: 7-Complete Independence: No helper   Medical Problem List and Plan:  1. Functional deficits secondary to bilateral total knee arthroplasties 11/23/2013 secondary to end-stage osteoarthritis  2. DVT bilateral calf, increased to full dose Xarelto, monitor bleeding, recheck doppler Monday for propagation 3. Pain Management: Oxycodone and Robaxin as needed. Monitor with increased mobility  4. Mood/depression: Prozac 20 mg daily. Provide emotional support  5. Neuropsych: This patient is capable of making decisions on his own behalf.  6. Skin/Wound Care: Routine skin care/monitor bilateral knee incisions  7. Acute blood loss anemia. Continue iron supplement. Followup CBC  8. Constipation. Adjust bowel program. No nausea vomiting  LOS (Days)  2 A FACE TO FACE EVALUATION WAS PERFORMED  KIRSTEINS,ANDREW E 11/29/2013, 9:10 AM

## 2013-11-29 NOTE — Progress Notes (Addendum)
Social Work Assessment and Plan  Patient Details  Name: Miguel Morris MRN: [id number] Date of Birth: March 20, 1951  Today's Date: 11/29/2013  Problem List:  Patient Active Problem List   Diagnosis Date Noted  . Status post bilateral knee replacements 11/27/2013  . Degenerative arthritis of knee, bilateral 11/23/2013  . Status post total bilateral knee replacement 11/23/2013   Past Medical History:  Past Medical History  Diagnosis Date  . Arthritis     oa and pain both knees  . Heart murmur     STATES BORN WITH HEART MURMUR BUT NO LONGER HAS THE MURMUR  . GERD (gastroesophageal reflux disease)     TUMS EVERY NIGHT  . Prostate enlargement     HX OF ELEVATED PSA - DR. TANNENBAUM IS PT'[S UROLOGIST   Past Surgical History:  Past Surgical History  Procedure Laterality Date  . Broken clavicle 1988    . Tonsillectomy      AS A CHILD  . Total knee arthroplasty Bilateral 11/23/2013    Procedure: TOTAL KNEE BILATERAL;  Surgeon: Kathryne Hitch, MD;  Location: [facility];  Service: Orthopedics;  Laterality: Bilateral;   Social History:  reports that he has quit smoking. He has never used smokeless tobacco. He reports that he drinks alcohol. He reports that he does not use illicit drugs.  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 [place] Other Supports: friends, neighbors Anticipated Caregiver: wife Ability/Limitations of Caregiver: none Caregiver Availability: 24/7 Family Dynamics: Close, supportive wife  Social History Preferred language: English Religion: Methodist Education: [employer] Read: Yes Write: Yes Employment Status: Employed IT sales professional: Owns and runs [employer] Return to Work Plans: Pt wants to return to the shop as soon as possible. Legal  History/Current Legal Issues: none reported  Guardian/Conservator: N/A   Abuse/Neglect Physical Abuse: Denies Verbal Abuse: Denies Sexual Abuse: Denies Exploitation of patient/patient's resources: Denies Self-Neglect: Denies  Emotional Status Pt's affect, behavior and adjustment status: Pt is upbeat and positive about his recovery.  He is motivated to get better and knows he will. Recent Psychosocial Issues: Pt reports being on Prozac for ADHD, not for depression, and he finds it helpful. Psychiatric History: none reported Substance Abuse History: none reported  Patient / Family Perceptions, Expectations & Goals Pt/Family understanding of illness & functional limitations: Pt has a good understanding of his condition and states his questions have been answered. Premorbid pt/family roles/activities: Pt enjoys biking (when his knees allowed), sitting on the front porch with his wife, reading, playing words with friends, working on his house. Anticipated changes in roles/activities/participation: Pt hopes to resume the above as he is able and return to work as soon as possible. Pt/family expectations/goals: Pt wants "to walk out of here on my own."  Pt wants to be as independent as possible.  Community Resources [employer]: None Premorbid Home Care/DME Agencies: None Transportation available at discharge: pt's wife  Discharge Planning Living Arrangements: Spouse/significant other Support Systems: Spouse/significant other Type of Residence: Private residence Civil engineer, contracting: Media planner (specify) Counselling psychologist) Financial Resources: Employment;Family Support Financial Screen Referred: No Living Expenses: Own Money Management: Patient;Spouse Does the patient have any problems obtaining your medications?: No Home Management: Pt's wife and friends/neighbors will help with this while pt is recovering. Patient/Family Preliminary Plans: Pt plans to go home with his wife. Social  Work Anticipated  Follow Up Needs: HH/OP Expected length of stay: 5 to 7 days  Clinical Impression CSW met with pt to introduce self and role of CSW to pt and to complete assessment.  Pt was very friendly and open with CSW.  He is motivated to recover and get back to home and work.  Pt describes himself as a "giver" so it is hard for him to be "on the receiving" end.  Pt was independent PTA and plans to return to that level soon.  Pt has a positive outlook and good support from family, friends, and neighbors.  Pt will likely have a short length of stay.   CSW will begin arrangements for pt's d/c.  Pt states no current needs/questions/concerns.  Pt's wife was not present at the time, so CSW will meet her the next time she visits.  Pt is borrowing a 3-in-1 and rolling walker.  Miguel Morris, Silvestre Mesi 11/29/2013, 2:45 PM

## 2013-11-29 NOTE — IPOC Note (Signed)
Overall Plan of Care [facility]) Patient Details Name: Miguel Morris MRN: [id number] DOB: 1951-03-11  Admitting Diagnosis: TKA  Hospital Problems: Active Problems:   Status post bilateral knee replacements     Functional Problem List: Nursing Endurance;Motor;Skin Integrity  PT Balance;Edema;Endurance;Motor;Skin Integrity;Safety;Pain  OT Balance;Edema;Endurance;Motor;Pain  SLP    TR         Basic ADL's: OT Bathing;Dressing;Toileting     Advanced  ADL's: OT Simple Meal Preparation     Transfers: PT Bed Mobility;Bed to Chair;Car;Furniture  OT Toilet;Tub/Shower     Locomotion: PT Ambulation;Wheelchair Mobility;Stairs     Additional Impairments: OT None  SLP        TR      Anticipated Outcomes Item Anticipated Outcome  Self Feeding no goal  Swallowing      Basic self-care  mod I   Toileting  mod I    Bathroom Transfers mod I   Bowel/Bladder  cont of bowel and bladder with mod I  Transfers  Mod I with RW  Locomotion  Mod I with RW  Communication     Cognition     Pain  pain level less than 3/10  Safety/Judgment  no falls with supervision    Therapy Plan: PT Intensity: Minimum of 1-2 x/day ,45 to 90 minutes PT Frequency: 5 out of 7 days PT Duration Estimated Length of Stay: 5 days OT Intensity: Minimum of 1-2 x/day, 45 to 90 minutes OT Frequency: 5 out of 7 days OT Duration/Estimated Length of Stay: 7 days         Team Interventions: Nursing Interventions Skin Care/Wound Management;Discharge Planning;Patient/Family Education  PT interventions Ambulation/gait training;Discharge planning;DME/adaptive equipment instruction;Functional mobility training;Pain management;Psychosocial support;Splinting/orthotics;Therapeutic Activities;UE/LE Strength taining/ROM;Balance/vestibular training;Disease management/prevention;Neuromuscular re-education;Patient/family education;Skin care/wound management;Stair training;Therapeutic Exercise;UE/LE Coordination  activities;Wheelchair propulsion/positioning  OT Interventions Balance/vestibular training;Community reintegration;Discharge planning;Disease mangement/prevention;Pain management;Self Care/advanced ADL retraining;Psychosocial support;Patient/family education;Therapeutic Activities;Therapeutic Exercise;UE/LE Strength taining/ROM  SLP Interventions    TR Interventions    SW/CM Interventions Discharge Planning;Psychosocial Support;Patient/Family Education    Team Discharge Planning: Destination: PT-Home ,OT- Home , SLP-  Projected Follow-up: PT-Outpatient PT;24 hour supervision/assistance, OT-  None, SLP-  Projected Equipment Needs: PT-Rolling walker with 5" wheels, OT- Other (comment), SLP-  Equipment Details: PT- , OT-Pt reports having access to 3:1 and shower seat for home Patient/family involved in discharge planning: PT- Patient;Family member/caregiver,  OT-Patient, SLP-   MD ELOS: 7d Medical Rehab Prognosis:  Excellent Assessment: 63 y.o. right-handed male with history of bilateral knee pain secondary to end-stage osteoarthritis and no relief with conservative care. Underwent bilateral total knee arthroplasties 11/23/2013 per Dr. Ninfa Linden  Now requiring 24/7 Rehab RN,MD, as well as CIR level PT, OT .  Treatment team will focus on ADLs and mobility with goals set at Mod I   See Team Conference Notes for weekly updates to the plan of care

## 2013-11-29 NOTE — Progress Notes (Signed)
Inpatient [facility] Individual Statement of Services  Patient Name:  Miguel Morris  Date:  11/29/2013  Welcome to the [facility].  Our goal is to provide you with an individualized program based on your diagnosis and situation, designed to meet your specific needs.  With this comprehensive rehabilitation program, you will be expected to participate in at least 3 hours of rehabilitation therapies Monday-Friday, with modified therapy programming on the weekends.  Your rehabilitation program will include the following services:  Physical Therapy (PT), Occupational Therapy (OT), 24 hour per day rehabilitation nursing, Case Management (Social Worker), Rehabilitation Medicine, Nutrition Services and Pharmacy Services  Weekly team conferences will be held on Tuesdays to discuss your progress.  Your Social Worker will talk with you frequently to get your input and to update you on team discussions.  Team conferences with you and your family in attendance may also be held.  Expected length of stay:  4-5 days  Overall anticipated outcome:  Modified Independent  Depending on your progress and recovery, your program may change. Your Social Worker will coordinate services and will keep you informed of any changes. Your Social Worker's name and contact numbers are listed  below.  The following services may also be recommended but are not provided by the [facility] will be made to provide these services after discharge if needed.  Arrangements include referral to agencies that provide these services.  Your insurance has been verified to be:  [place] primary doctor is:  Dr. Hulan Fess  Pertinent information will be shared with your doctor and your insurance company.  Social Worker:  Alfonse Alpers, LCSW   [phone] or ([phone]  Information discussed with and copy given to patient by: Trey Sailors, 11/29/2013, 9:22 AM

## 2013-11-29 NOTE — Progress Notes (Signed)
Reported by PT that patient reports dizziness, and lightheadedness with activity, and patient oxygen saturation was 81% on room air. Noted patient at rest without complaints of dizziness, or SOB. Reported findings to [facility]PA given order for oxygen per nasal cannula at 2 liters.  Placed oxygen on patient oxygen saturation at 100% on 2 liters per nasal cannula. Advised the patient to change positions slowly specifically from sitting to standing. Patient verbalized understanding. Will continue to monitor. Miguel Morris

## 2013-11-29 NOTE — Progress Notes (Signed)
Occupational Therapy Session Note  Patient Details  Name: Chazz Philson MRN: [id number] Date of Birth: 1951-04-18  Today's Date: 11/29/2013 Time: 0730-0830 Time Calculation (min): 60 min  Short Term Goals: Week 1:  OT Short Term Goal 1 (Week 1): STG=LTG secondary to short LOS  Skilled Therapeutic Interventions/Progress Updates: ADL-retraining with focus on dynamic standing balance, transfers, DME and AE training.   Pt received sitting in bed, having finished breakfast and receptive for ADL.   Pt was re-educated on use of KI and required assist to don left KI to ambulate short distance to toilet.   Pt completed transfer with steadying assist and clothing management with supervision and setup to elevate right leg while sitting.     Pt completed BM and was educated on use of reacher for improved independence with clothing management to pull up pants.   Pt completed transfer to tub bench and bathed using LH sponge to reach back and feet this session.   Pt ambulated to sink to comb his hair and brush his teeth while standing but demo'd mild LOB while attempting to don shirt standing.    Pt returned to edge of bed to dress using reacher to lace pants and stood to pull up pants at RW with only standby assist for safety.   Pt returned to bed, using leg lifter to bring legs back into bed unassisted.    Call light and phone placed within reach at end of session.    Therapy Documentation Precautions:  Precautions Precautions: Fall;Knee Precaution Comments: removed R KI for ambulation Required Braces or Orthoses: Knee Immobilizer - Right;Knee Immobilizer - Left Knee Immobilizer - Right: Discontinue once straight leg raise with < 10 degree lag Knee Immobilizer - Left: Discontinue once straight leg raise with < 10 degree lag Restrictions Weight Bearing Restrictions: Yes Other Position/Activity Restrictions: WBAT  Pain: Pain Assessment Pain Score: 0-No pain  ADL: ADL ADL Comments: see FIM  See FIM  for current functional status  Therapy/Group: Individual Therapy  Second session: Time: 1300-1400 Time Calculation (min):  60 min  Pain Assessment: 4/10, right knee  Skilled Therapeutic Interventions: Therapeutic activity with focus on endurance, mobility, standard bed transfers, and improved management of LE and orthosis (knee immobilizers).   Pt ambulated from corridor to rehab apartment, 158', rested and performed transfers, sit<>stands as instructed, and doffed and donned right knee immobilizer (unable to completed left) with min instructional cues and supervision for safety d/t need for 1 L oxygen and variable oxygen saturation level with c/o episodes of dizziness.   Pt completed bed mobility in standard bed unassisted using only leg lifter and was able to appropriately monitor his symptoms, requesting rest breaks (3 times) as needed during session.   Portable pulse oximetry readings were inconsistent (low 80s to mid 90s) and required repeated checks due to delayed response of device.   Pt reports chronic poor circulation in both hands as possibly contributing to inconsistent readings on pulse oximeter.      See FIM for current functional status  Therapy/Group: Individual Therapy  Zerina Hallinan 11/29/2013, 8:53 AM

## 2013-11-30 ENCOUNTER — Encounter (HOSPITAL_COMMUNITY): Payer: Managed Care, Other (non HMO) | Admitting: Occupational Therapy

## 2013-11-30 ENCOUNTER — Inpatient Hospital Stay (HOSPITAL_COMMUNITY): Payer: Managed Care, Other (non HMO) | Admitting: Physical Therapy

## 2013-11-30 NOTE — Progress Notes (Signed)
Subjective/Complaints: Per PT Full ext, R knee flex to 70deg, Left knee to ~90 deg Hasn't tried steps No CP or SOB Discussed doppler results at length  Objective: Vital Signs: Blood pressure 144/90, pulse 65, temperature 98.1 F (36.7 C), temperature source Oral, resp. rate 18, height 6' (1.829 m), weight 83.1 kg (183 lb 3.2 oz), SpO2 99.00%. No results found. Results for orders placed during the hospital encounter of 11/27/13 (from the past 72 hour(s))  CBC WITH DIFFERENTIAL     Status: Abnormal   Collection Time    11/28/13  6:28 AM      Result Value Ref Range   WBC 5.3  4.0 - 10.5 K/uL   RBC 3.04 (*) 4.22 - 5.81 MIL/uL   Hemoglobin 8.4 (*) 13.0 - 17.0 g/dL   HCT 25.4 (*) 39.0 - 52.0 %   MCV 83.6  78.0 - 100.0 fL   MCH 27.6  26.0 - 34.0 pg   MCHC 33.1  30.0 - 36.0 g/dL   RDW 15.9 (*) 11.5 - 15.5 %   Platelets 212  150 - 400 K/uL   Neutrophils Relative % 66  43 - 77 %   Neutro Abs 3.5  1.7 - 7.7 K/uL   Lymphocytes Relative 19  12 - 46 %   Lymphs Abs 1.0  0.7 - 4.0 K/uL   Monocytes Relative 10  3 - 12 %   Monocytes Absolute 0.5  0.1 - 1.0 K/uL   Eosinophils Relative 4  0 - 5 %   Eosinophils Absolute 0.2  0.0 - 0.7 K/uL   Basophils Relative 1  0 - 1 %   Basophils Absolute 0.0  0.0 - 0.1 K/uL  COMPREHENSIVE METABOLIC PANEL     Status: Abnormal   Collection Time    11/28/13  6:28 AM      Result Value Ref Range   Sodium 139  137 - 147 mEq/L   Potassium 4.5  3.7 - 5.3 mEq/L   Chloride 100  96 - 112 mEq/L   CO2 29  19 - 32 mEq/L   Glucose, Bld 115 (*) 70 - 99 mg/dL   BUN 14  6 - 23 mg/dL   Creatinine, Ser 0.75  0.50 - 1.35 mg/dL   Calcium 8.2 (*) 8.4 - 10.5 mg/dL   Total Protein 5.4 (*) 6.0 - 8.3 g/dL   Albumin 2.4 (*) 3.5 - 5.2 g/dL   AST 22  0 - 37 U/L   ALT 25  0 - 53 U/L   Alkaline Phosphatase 37 (*) 39 - 117 U/L   Total Bilirubin 0.7  0.3 - 1.2 mg/dL   GFR calc non Af Amer >90  >90 mL/min   GFR calc Af Amer >90  >90 mL/min   Comment: (NOTE)     The eGFR has been  calculated using the CKD EPI equation.     This calculation has not been validated in all clinical situations.     eGFR's persistently <90 mL/min signify possible Chronic Kidney     Disease.   Anion gap 10  5 - 15     HEENT: normal Cardio: RRR and No Murmur Resp: CTA B/L and unlabored GI: BS positive and NT, ND Extremity:  Pulses positive and No Edema Skin:   Wound C/D/I Neuro: Alert/Oriented Musc/Skel:  Extremity tender mild calf tenderness and Swelling bilateral LEs Gen NAD   Assessment/Plan: 1. Functional deficits secondary to Bilateral Knee OA end stage s/p TKR which require 3+ hours per  day of interdisciplinary therapy in a comprehensive inpatient rehab setting. Physiatrist is providing close team supervision and 24 hour management of active medical problems listed below. Physiatrist and rehab team continue to assess barriers to discharge/monitor patient progress toward functional and medical goals. FIM: FIM - Bathing Bathing Steps Patient Completed: Chest;Right Arm;Left Arm;Abdomen;Front perineal area;Buttocks;Right upper leg;Left upper leg;Right lower leg (including foot);Left lower leg (including foot) Bathing: 5: Supervision: Safety issues/verbal cues (using LH sponge)  FIM - Upper Body Dressing/Undressing Upper body dressing/undressing steps patient completed: Thread/unthread right sleeve of pullover shirt/dresss;Put head through opening of pull over shirt/dress;Thread/unthread left sleeve of pullover shirt/dress;Pull shirt over trunk Upper body dressing/undressing: 5: Set-up assist to: Obtain clothing/put away FIM - Lower Body Dressing/Undressing Lower body dressing/undressing steps patient completed: Thread/unthread right pants leg;Thread/unthread left pants leg;Pull pants up/down Lower body dressing/undressing: 3: Mod-Patient completed 50-74% of tasks (using reacher)  FIM - Toileting Toileting steps completed by patient: Adjust clothing prior to toileting;Performs  perineal hygiene;Adjust clothing after toileting Toileting Assistive Devices: Grab bar or rail for support Toileting: 5: Supervision: Safety issues/verbal cues  FIM - Radio producer Devices: Environmental consultant;Bedside commode Toilet Transfers: 5-To toilet/BSC: Supervision (verbal cues/safety issues)  FIM - Control and instrumentation engineer Devices: Walker;Orthosis;Arm rests (Left KI) Bed/Chair Transfer: 5: Supine > Sit: Supervision (verbal cues/safety issues);5: Sit > Supine: Supervision (verbal cues/safety issues)  FIM - Locomotion: Wheelchair Distance: 400 Locomotion: Wheelchair: 0: Activity did not occur FIM - Locomotion: Ambulation Locomotion: Ambulation Assistive Devices: Walker - Rolling;Orthosis;Other (comment) (left KI) Ambulation/Gait Assistance: 5: Supervision Locomotion: Ambulation: 2: Travels 63 - 149 ft with supervision/safety issues  Comprehension Comprehension Mode: Auditory Comprehension: 7-Follows complex conversation/direction: With no assist  Expression Expression Mode: Verbal Expression: 7-Expresses complex ideas: With no assist  Social Interaction Social Interaction: 6-Interacts appropriately with others with medication or extra time (anti-anxiety, antidepressant).  Problem Solving Problem Solving: 7-Solves complex problems: Recognizes & self-corrects  Memory Memory: 7-Complete Independence: No helper   Medical Problem List and Plan:  1. Functional deficits secondary to bilateral total knee arthroplasties 11/23/2013 secondary to end-stage osteoarthritis  2. DVT bilateral calf, increased to full dose Xarelto, monitor bleeding, recheck doppler Monday for propagation 3. Pain Management: Oxycodone and Robaxin as needed. Monitor with increased mobility  4. Mood/depression: Prozac 20 mg daily. Provide emotional support  5. Neuropsych: This patient is capable of making decisions on his own behalf.  6. Skin/Wound Care: Routine  skin care/monitor bilateral knee incisions  7. Acute blood loss anemia. Continue iron supplement. Followup CBC  8. Constipation. Resolved using dried fruit LOS (Days) 3 A FACE TO FACE EVALUATION WAS PERFORMED  KIRSTEINS,ANDREW E 11/30/2013, 9:18 AM

## 2013-11-30 NOTE — Discharge Summary (Signed)
Miguel Morris, Miguel Morris NO.:  [id number]  MEDICAL RECORD NO.:  [id number]  LOCATION:  4W06C                        FACILITY:  [facility]  PHYSICIAN:  Lauraine Rinne, P.A.  DATE OF BIRTH:  Apr 01, 1951  DATE OF ADMISSION:  11/27/2013 DATE OF DISCHARGE:  12/03/2013                              DISCHARGE SUMMARY   DISCHARGE DIAGNOSES: 1. Functional deficits secondary to bilateral total knee arthroplasty. 2. Deep vein thrombosis in right posterior tibial vein, left posterior     tibial vein, and left soleal vein. 3. Pain management. 4. Acute blood loss anemia. 5. Depression.  HISTORY OF PRESENT ILLNESS:  This is a 63 year old right-handed male with history of bilateral knee pain secondary to osteoarthritis, no relief with conservative care.  Underwent bilateral total knee arthroplasties on November 23, 2013, per Dr. Ninfa Linden.  Postoperative pain management.  Weightbearing as tolerated in bilateral lower extremities. Placed on Xarelto or DVT prophylaxis.  Acute blood loss anemia, 8.9 and monitored.  The patient was admitted for comprehensive rehab program.  PAST MEDICAL HISTORY:  See discharge diagnoses.  SOCIAL HISTORY:  Lives with spouse.  FUNCTIONAL HISTORY:  Prior to admission, independent and active. Functional status upon admission to rehab services was minimal assist 60 feet with a rolling walker, minimal assist sit to stand, min to mod assist activities daily living.  PHYSICAL EXAMINATION:  VITAL SIGNS:  Blood pressure 124/81, pulse 77, temperature 99 respirations 18. GENERAL:  This was an alert male, oriented x3. LUNGS:  Clear to auscultation. CARDIAC: Regular rate and rhythm. ABDOMEN:  Soft, nontender.  Good bowel sounds. EXTREMITIES:  Bilateral knee incisions dressed, appropriately tender.  REHABILITATION HOSPITAL COURSE:  The patient was admitted to inpatient rehab services with therapies initiated on a 3-hour daily basis consisting of physical therapy,  occupational therapy, and rehabilitation nursing.  The following issues were addressed during the patient's rehabilitation stay.  Pertaining to [name] bilateral total knee arthroplasties on November 23, 2013; neurovascular sensation intact, weightbearing as tolerated.  He would follow up with Dr. Ninfa Linden of Orthopedic Services.  Vascular studies completed November 28, 2013, showing deep vein thrombosis in right posterior tibial vein as well as posterior tibial vein on the left and left soleal vein.  Due to these findings, his Xarelto was increased to 15 mg b.i.d.  No bleeding episodes.  He had no chest pain or shortness of breath throughout its course.  Blood pressures remained well controlled.  He continued on iron supplement for acute blood loss anemia.  No bleeding episodes noted. Blood pressure is controlled.  No orthostatic a cyst.  No bowel or bladder disturbances  other than some mild constipation.  The patient received weekly collaborative interdisciplinary team conferences to discuss estimated length of stay, family teaching, any barriers to discharge.  Ambulating extended distances with rolling walker supervision, ambulated to a sink with rolling walker up to a 100 feet in a controlled environment, able to navigate stairs.  Activities daily living home making essentially independent other than some mild assistance for lower body dressing.  Strength and endurance continued to improve.  Home health therapies were recommended.  Discharge date December 01 1013.  DISCHARGE MEDICATIONS: 1. Ferrous sulfate 325  mg p.o. t.i.d. 2. Prozac 20 mg p.o. daily. 3. Claritin 10 mg p.o. daily. 4. Robaxin  500 mg p.o. every 6 hours as needed. 5. Muscle spasms. 6. Oxycodone immediate release 5-15 mg p.o. every 3 hours as needed     pain, dispensed 90 tablets. 7  Xarelto 15 mg p.o. b.i.d. x21 days, then 20 mg daily.  DIET:  Regular.  SPECIAL INSTRUCTIONS:  The patient would follow up Dr.  Ninfa Linden of orthopedic Services 2 weeks, call for appointment; Dr. Hulan Fess of medical management; Dr. Alger Simons at the outpatient rehab service office as needed.     Lauraine Rinne, P.A.     DA/MEDQ  D:  11/30/2013  T:  11/30/2013  Job:  [id number]  cc:   Lennette Bihari L. Little, M.D. Lind Guest. Ninfa Linden, M.D.

## 2013-11-30 NOTE — Progress Notes (Signed)
Physical Therapy Session Note  Patient Details  Name: Miguel Morris MRN: [id number] Date of Birth: 08/24/1950  Today's Date: 11/30/2013 Time: 0830-0930 Tx 2: 1330-1430 Time Calculation (min): 60 min Tx 2: 60 minutes  Short Term Goals: Week 1:  PT Short Term Goal 1 (Week 1): STGs = LTGs due to LOS  Skilled Therapeutic Interventions/Progress Updates:    Therapeutic Activity: PT instructs pt in toilet transfer req Supervision with RW, including washing hands. Pt manages pants and hygiene. Pt doffs B nonskid socks and dons regular B socks and shoes when PT brings him supplies with set-up assist.   Gait Training: PT instructs pt in ambulation with RW x 200' req SBA and verbal cues to bend knees and stand tall. PT instructs pt in ascending 3 stairs with R rail in step-to pattern req SBA and descending 3 stairs with B rail req CGA-SBA - x 2 sets.   Therapeutic Exercise: PT instructs pt in bed level exercises: ankle pumps x 20, supine hip abduction/adduction x 20, heel slides x 20 (AAROM with leg lifter self assisting), LAQ x 20, seated knee flexion x 20 reps. Pt achieves 88 degrees L knee flexion on best attempt. Pt achieves 71 degrees R knee flexion on best attempt.   Tx 2: Gait Training: PT instructs pt in ambulation with RW x 200' req SBA and verbal cues to bend knees, stand tall, and loosen grip on walker. PT instructs pt in ascending 3 stairs with R rail req SBA in step-to pattern; pt req verbal cues for technique. PT instructs pt in descending 3 stairs with R rail req SBA in step-to pattern; pt req verbal cues for technique and demonstrates a compensated pattern of leaning entire body against R rail on descent. Stairs done 2 sets.   Therapeutic Exercise: PT instructs pt on Nu-Step at L2 with B UEs/LEs, focus on knee flexion ROM. Pt begins at seat position 12, progresses to 11 and then to 10. Total time: 15 minutes, with rest breaks as needed. PT adjusted CPM machines to achieve a 90 degree  knee flexion angle.   Therapeutic Activity: PT instructs pt in sit to stand from w/c and edge of bed mulitple times during session req SBA for safety and verbal cues to keep knees bent as much as possible, with RW. Pt demonstrates mod I bed mobility supine to sit. Pt demonstrates mod I sit to supine transfer in bed - heavy reliance on B UEs.   Therapy Documentation Precautions:  Precautions Precautions: Fall;Knee Precaution Comments: removed R KI for ambulation Required Braces or Orthoses: Knee Immobilizer - Right;Knee Immobilizer - Left Knee Immobilizer - Right: Discontinue once straight leg raise with < 10 degree lag Knee Immobilizer - Left: Discontinue once straight leg raise with < 10 degree lag Restrictions Weight Bearing Restrictions: Yes Other Position/Activity Restrictions: WBAT Pain: Pain Assessment Pain Assessment: 0-10 Pain Score: 1  Pain Type: Surgical pain Pain Location: Knee Pain Orientation: Right;Left Pain Descriptors / Indicators: Aching Pain Frequency: Intermittent Pain Onset: On-going Patients Stated Pain Goal: 0 Pain Intervention(s): Rest Multiple Pain Sites: No Tx 2: Pt c/o 5-6/10 in B knees during stair activity and PT instructs pt to rest to decrease pain.   See FIM for current functional status  Therapy/Group: Individual Therapy  Deniz Hannan M 11/30/2013, 8:34 AM

## 2013-11-30 NOTE — Discharge Summary (Signed)
Discharge summary job # [id number]

## 2013-11-30 NOTE — Progress Notes (Signed)
Occupational Therapy Session Note  Patient Details  Name: Miguel Morris MRN: [id number] Date of Birth: January 22, 1951  Today's Date: 11/30/2013 Time: 7408-1448 Time Calculation (min): 60 min  Short Term Goals: Week 1:  OT Short Term Goal 1 (Week 1): STG=LTG secondary to short LOS  Skilled Therapeutic Interventions/Progress Updates:  Pt with no c/o pain during session. Upon arrival, pt seated in wheelchair awaiting therapist arrival. OT session with focus on dynamic standing balance, functional transfer, functional mobility, and ADL training. Pt ambulated to tub transfer bench ~ 10 feet with steady A. Tub transfer with supervision and use of RW. Bathing performed with supervision and verbal cues to use long handled sponge for B feet and back. Pt ambulated out of bathroom with steady assist and RW to sit at sink side for grooming with set up A. OT demonstrated wheelchair push ups with patient returning  demonstration of 2 sets of 10 in order to increase B UE strength for functional transfers.  Therapy Documentation Precautions:  Precautions Precautions: Fall;Knee Precaution Comments: removed R KI for ambulation Required Braces or Orthoses: Knee Immobilizer - Right;Knee Immobilizer - Left Knee Immobilizer - Right: Discontinue once straight leg raise with < 10 degree lag Knee Immobilizer - Left: Discontinue once straight leg raise with < 10 degree lag Restrictions Weight Bearing Restrictions: Yes Other Position/Activity Restrictions: WBAT Pain: Pain Assessment Pain Assessment: No/denies pain Pain Score: 1  Pain Type: Surgical pain Pain Location: Knee Pain Orientation: Right;Left Pain Descriptors / Indicators: Aching Pain Frequency: Intermittent Pain Onset: On-going Patients Stated Pain Goal: 0 Pain Intervention(s): Rest Multiple Pain Sites: No ADL: ADL ADL Comments: see FIM  See FIM for current functional status  Therapy/Group: Individual Therapy  Phineas Semen 11/30/2013,  12:17 PM

## 2013-12-01 ENCOUNTER — Inpatient Hospital Stay (HOSPITAL_COMMUNITY): Payer: Managed Care, Other (non HMO) | Admitting: Physical Therapy

## 2013-12-01 ENCOUNTER — Inpatient Hospital Stay (HOSPITAL_COMMUNITY): Payer: Managed Care, Other (non HMO) | Admitting: Occupational Therapy

## 2013-12-01 NOTE — Progress Notes (Signed)
Social Work Patient ID: Marcelle Smiling, male   DOB: May 03, 1950, 63 y.o.   MRN: [id number]  CSW met with pt and his wife on 11-30-13.  Pt was hopeful for d/c on 12-01-13, but understands the need to stay for a repeat doppler study on Monday.  CSW talked with pt about f/u physical therapy and he is open to outpt therapy at [facility] location.  CSW left message for [facility] outpt PT and await their return call.  CSW, Lennart Pall, will f/u with them on Monday.  Pt is borrowing a rolling walker and bedside commode.  He will need a shower seat and his wife will obtain this for him.  CSW set up pt's follow up appt with pt's PCP.  No other needs identified at this time.  CSW will continue to follow and assist as needed, hopefully with d/c on 12-03-13.

## 2013-12-01 NOTE — Progress Notes (Signed)
Occupational Therapy Session Note  Patient Details  Name: Miguel Morris MRN: [id number] Date of Birth: 1950/06/29  Today's Date: 12/01/2013 Time: 1448-1856 Time Calculation (min): 60 min  Short Term Goals: Week 1:  OT Short Term Goal 1 (Week 1): STG=LTG secondary to short LOS  Skilled Therapeutic Interventions/Progress Updates:  Pt with no c/o pain this session. Pt seated on commode upon entering the room with RN present. OT session with focus on ADL retraining and dynamic standing balance. Pt performed tub transfer onto tub transfer bench with RW with supervision. All ADL tasks with supervision this session. Pt ambulated to drawer to obtain clothing needed with supervision and use of RW. Pt demonstrated the ability to donn TED hose independently onto B LEs. Dressing performed seated EOB. Pt standing to pull clothing up legs and B hips with supervision and pt standing within RW without hands on it. Grooming performed standing with RW at sink side. Pt making great progress towards goals.   Therapy Documentation Precautions:  Precautions Precautions: Fall;Knee Precaution Comments: removed R KI for ambulation Required Braces or Orthoses: Knee Immobilizer - Right;Knee Immobilizer - Left Knee Immobilizer - Right: Discontinue once straight leg raise with < 10 degree lag Knee Immobilizer - Left: Discontinue once straight leg raise with < 10 degree lag Restrictions Weight Bearing Restrictions: Yes RLE Weight Bearing: Weight bearing as tolerated LLE Weight Bearing: Weight bearing as tolerated Other Position/Activity Restrictions: WBAT    See FIM for current functional status  Therapy/Group: Individual Therapy  Phineas Semen 12/01/2013, 12:41 PM

## 2013-12-01 NOTE — Progress Notes (Signed)
Subjective/Complaints: Feels well Understands issue with DVT and treatment plan  Objective: Vital Signs: Blood pressure 137/83, pulse 69, temperature 98.1 F (36.7 C), temperature source Oral, resp. rate 17, height 6' (1.829 m), weight 183 lb 3.2 oz (83.1 kg), SpO2 93.00%.  nad Chest cta cv- reg rate abd- soft Knees- no surgical site redness or inflammation   Assessment/Plan: 1. Functional deficits secondary to Bilateral Knee OA end stage s/p TKR   Medical Problem List and Plan:  1. Functional deficits secondary to bilateral total knee arthroplasties 11/23/2013 secondary to end-stage osteoarthritis  2. DVT bilateral calf, increased to full dose Xarelto, monitor bleeding, recheck doppler Monday for propagation 3. Pain Management: Oxycodone and Robaxin as needed. Monitor with increased mobility  4. Mood/depression: Prozac 20 mg daily. Provide emotional support  5. Neuropsych: This patient is capable of making decisions on his own behalf.  6. Skin/Wound Care: Routine skin care/monitor bilateral knee incisions  7. Acute blood loss anemia. Continue iron supplement. Followup CBC  8. Constipation. Resolved using dried fruit LOS (Days) 4 A FACE TO FACE EVALUATION WAS PERFORMED  SWORDS,BRUCE HENRY 12/01/2013, 9:54 AM

## 2013-12-01 NOTE — Progress Notes (Signed)
Physical Therapy Session Note  Patient Details  Name: Miguel Morris MRN: [id number] Date of Birth: [birth year]  Today's Date: 12/01/2013 Time: 1005-1105 Tx 2: 1300-1400 Time Calculation (min): 60 min Tx: 60   Short Term Goals: Week 1:  PT Short Term Goal 1 (Week 1): STGs = LTGs due to LOS  Skilled Therapeutic Interventions/Progress Updates:    Gait Training: PT instructs pt in ambulation with RW x 200' req SBA and verbal cues to bend the knees and try to keep the hips still - pt compensates for lack of knee flexion by hiking hips, bilaterally. PT instructs pt in ascending/descending 3 stairs with 1 rail and SPC x 2 sets req SBA and verbal cues for technique.  PT instructs pt in ambulation with SPC 20' x 2 reps req CGA and verbal cues for 2 point gait technique - PT instructs pt to hold cane in L hand.   Therapeutic Activity: Pt demonstrates mod I supine to sit without rail. PT instructs pt in safe sit to stand with RW req SBA for safety and verbal cues for foot placement in order to increase functional use of knee flexion. PT instructs pt in toilet transfer req SBA for safety with RW and verbal cues to keep feet planted to force knees to bend when sitting on the toilet. SBA transfer off toilet with RW.  PT instructs pt in car transfer with SPC req CGA and verbal cues for technique.   Therapeutic Exercise: PT instructs pt in seated knee flexion and LAQ 2 x 20 reps with verbal cues to breathe.  PT instructs pt in SLR to R and L LE 2 x 10 reps with verbal cues for technique: push your knee straight prior to lifting it by pushing down through your heel.  PT instructs pt in side lie hip abduction 2 x 10 reps on each side; pt demonstrates L hip abductor weakness > R hip abductor weakness.   Session 2: Therapeutic Exercise: PT instructs pt on Nu-step for a total of 15 minutes, rest breaks as needed, starting at seat position at 12 progression to 10. Pt is able to achieve 88 degrees knee flexion on  R and 91 degrees knee flexion on L. PT instructs pt in calf stretch in long sit with blanket x 60 seconds x 2 sets each LE. PT instructs pt in supine hamstring stretch with blanket x 60 seconds x 2 sets each LE.   Gait Training: PT instructs pt in ambulation with SPC x 150' with verbal cues for knee flexion req CGA-min A as needed.   Therapeutic Activity: PT instructs pt in ambulation with SPC to toilet req CGA for safety - pt demonstrates good use of cane technique, no LOB. PT instructs pt in dynamic standing balance activity, brushing teeth, with RW for safety req SBA.   Pt is progressing with functional mobility, tolerating progression to [facility] during therapy only to continue improvement. Pt's safety on stairs is vastly improved with use of SPC with handrail. Pt continues to have difficulty with knee flexion, but is motivated to participate in exercises.      Therapy Documentation Precautions:  Precautions Precautions: Fall;Knee Precaution Comments: removed R KI for ambulation Required Braces or Orthoses: Knee Immobilizer - Right;Knee Immobilizer - Left Knee Immobilizer - Right: Discontinue once straight leg raise with < 10 degree lag Knee Immobilizer - Left: Discontinue once straight leg raise with < 10 degree lag Restrictions Weight Bearing Restrictions: Yes RLE Weight Bearing: Weight bearing as tolerated LLE  Weight Bearing: Weight bearing as tolerated Other Position/Activity Restrictions: WBAT       Pain: Pain Assessment Pain Assessment: 0-10 Pain Score: 2  Pain Type: Surgical pain Pain Location: Knee Pain Orientation: Right;Left Pain Descriptors / Indicators: Aching;Sore Pain Onset: On-going Pain Intervention(s): Rest;Repositioned Multiple Pain Sites: No Tx 2: Pt denies pain at beginning of PT, but c/o 6-8/10 pain in knees via FACES scale during Nu-Step activity. Pain abolishes with rest.  See FIM for current functional status  Therapy/Group: Individual  Therapy  Davide Risdon M 12/01/2013, 10:13 AM

## 2013-12-01 NOTE — Discharge Instructions (Addendum)
Inpatient Rehab Discharge Instructions  Future [place] Discharge date and time: 12/03/13   Activities/Precautions/ Functional Status: Activity: activity as tolerated Diet: regular diet Wound Care: keep wound clean and dry Functional status:  ___ No restrictions     ___ Walk up steps independently ___ 24/7 supervision/assistance   ___ Walk up steps with assistance _X__ Intermittent supervision/assistance  ___ Bathe/dress independently _X__ Walk with walker    _X__ Bathe/dress with assistance ___ Walk Independently    ___ Shower independently ___ Walk with assistance    ___ Shower with assistance _X__ No alcohol     ___ Return to work/school ________    COMMUNITY REFERRALS UPON DISCHARGE:    Outpatient: PT                   Agency: [facility] [phone] [address] Phone: [phone]              Appointment Date/Time: 8/18 @ 10:15 am (please arrive 9:45)  Medical Equipment/Items Ordered: CPM for home                                                     Agency/Supplier: [facility] @ [phone]     Special Instructions: 1. No driving 2. NO aspirin, ibuprofen, aleve or motrin.    My questions have been answered and I understand these instructions. I will adhere to these goals and the provided educational materials after my discharge from the hospital.  Patient/Caregiver Signature _______________________________ Date __________  Clinician Signature _______________________________________ Date __________  Please bring this form and your medication list with you to all your follow-up doctor's appointments.     Information on my medicine - XARELTO (rivaroxaban)  This medication education was reviewed with me or my healthcare representative as part of my discharge preparation.  The pharmacist that spoke with me during my hospital stay was:  Bajbus, Lauren, RPH  WHY WAS XARELTO PRESCRIBED FOR YOU? Xarelto was prescribed to treat blood clots that may  have been found in the veins of your legs (deep vein thrombosis) or in your lungs (pulmonary embolism) and to reduce the risk of them occurring again.  What do you need to know about Xarelto? The starting dose is one 15 mg tablet taken TWICE daily with food for the FIRST 21 DAYS then on 12/20/2013  the dose is changed to one 20 mg tablet taken ONCE A DAY with your evening meal.  DO NOT stop taking Xarelto without talking to the health care provider who prescribed the medication.  Refill your prescription for 20 mg tablets before you run out.  After discharge, you should have regular check-up appointments with your healthcare provider that is prescribing your Xarelto.  In the future your dose may need to be changed if your kidney function changes by a significant amount.  What do you do if you miss a dose? If you are taking Xarelto TWICE DAILY and you miss a dose, take it as soon as you remember. You may take two 15 mg tablets (total 30 mg) at the same time then resume your regularly scheduled 15 mg twice daily the next day.  If you are taking Xarelto ONCE DAILY and you miss a dose, take it as soon as you remember on the same day then continue your regularly scheduled once daily  regimen the next day. Do not take two doses of Xarelto at the same time.   Important Safety Information Xarelto is a blood thinner medicine that can cause bleeding. You should call your healthcare provider right away if you experience any of the following:   Bleeding from an injury or your nose that does not stop.   Unusual colored urine (red or dark brown) or unusual colored stools (red or black).   Unusual bruising for unknown reasons.   A serious fall or if you hit your head (even if there is no bleeding).  Some medicines may interact with Xarelto and might increase your risk of bleeding while on Xarelto. To help avoid this, consult your healthcare provider or pharmacist prior to using any new prescription or  non-prescription medications, including herbals, vitamins, non-steroidal anti-inflammatory drugs (NSAIDs) and supplements.  This website has more information on Xarelto: [email or web address]

## 2013-12-02 ENCOUNTER — Inpatient Hospital Stay (HOSPITAL_COMMUNITY): Payer: Managed Care, Other (non HMO)

## 2013-12-02 ENCOUNTER — Inpatient Hospital Stay (HOSPITAL_COMMUNITY): Payer: Managed Care, Other (non HMO) | Admitting: Occupational Therapy

## 2013-12-02 DIAGNOSIS — Z96659 Presence of unspecified artificial knee joint: Secondary | ICD-10-CM

## 2013-12-02 NOTE — Progress Notes (Signed)
Subjective/Complaints: Feels well Eager to go home--likely tomorrow  Objective: Vital Signs: Blood pressure 149/84, pulse 65, temperature 97.6 F (36.4 C), temperature source Axillary, resp. rate 18, height 6' (1.829 m), weight 183 lb 3.2 oz (83.1 kg), SpO2 95.00%.  nad Chest cta cv- reg rate abd- soft Knees- no surgical site redness or inflammation   Assessment/Plan: 1. Functional deficits secondary to Bilateral Knee OA end stage s/p TKR   Medical Problem List and Plan:  1. Functional deficits secondary to bilateral total knee arthroplasties 11/23/2013 secondary to end-stage osteoarthritis  2. DVT bilateral calf, increased to full dose Xarelto, monitor bleeding, recheck doppler Monday for propagation No s/s of bleeding Home tomorrow after doppler studies 3. Pain Management: Oxycodone and Robaxin as needed. Monitor with increased mobility  4. Mood/depression: Prozac 20 mg daily. Provide emotional support  5. Neuropsych: This patient is capable of making decisions on his own behalf.  6. Skin/Wound Care: Routine skin care/monitor bilateral knee incisions  7. Acute blood loss anemia. Continue iron supplement. Followup CBC  8. Constipation. Resolved  LOS (Days) 5 A FACE TO FACE EVALUATION WAS PERFORMED  Drinda Belgard HENRY 12/02/2013, 9:13 AM

## 2013-12-02 NOTE — Progress Notes (Signed)
Physical Therapy Session Note  Patient Details  Name: Miguel Morris MRN: [id number] Date of Birth: Jul 23, 1950  Today's Date: 12/02/2013 Session 1 Time: 1105-1205 Time Calculation (min): 60 min Session 2 Time: 1300-1400 Time Calculation (min): 60 min   Short Term Goals: Week 1:  PT Short Term Goal 1 (Week 1): STGs = LTGs due to LOS  Skilled Therapeutic Interventions/Progress Updates:    Session 1: Session focused on functional ambulation, B knee ROM/strengthening. Ambulated 10' w/ SPC to bathroom, dynamic standing balance to manage clothing before/after toileting w/ S. Ambulated w/ SPC 100' w/ MinGuard, then w/ RW 41' w/ S. Therapist performed manual stretching for B knee flexion/extension seated EOM. Pt moved sit>supine>SL>prone w/ mod (I). Performed stretching and HS curls in prone for knee flexion including hold-release technique for increased ROM. Pt performed x10 TKE in prone for quad strength and knee extension range. In supine pt performed 2x10 SAQ w/ large bolster and 2x10 bridges w/ bolster under knees. Pt ambulated w/ SPC over obstacles of varying heights w/ 1 LOB requiring ModA to correct while stepping over highest obstacle. Pt ambulated 150' back to room w/ [facility] w/ MinA for safety. Pt left seated in w/c w/ wife present w/ all needs within reach.  Session 2: Session focused on functional endurance and strengthening, functional mobility, B knee ROM. Ambulated 10' w/ SPC to bathroom, dynamic standing balance to manage clothing before/after toileting w/ S. Pt ambulated 150' to gym w/ SPC w/ MinGuard to close S. Pt performed 10' on Nustep with seat initially at 11, progressed to 10 and 9 after warm up. Pt performed x10 mini-squats w/ no UE support at edge of mat for B knee ROM/quad control. Pt transferred in/out of practice car w/ S and min VC's for hand placement. Pt negotiated up/down 4 stairs x4 w/ 1 rail and SPC w/ S, no VC's needed for sequencing. Therapist measured knee flexion PROM at  99 degrees on L and 91 degrees on R while seated edge of mat. Pt w/ complaint of dizziness/lightheadedness during passive flexion stretch. SpO2 normal, no complaints of SOB. Bout of dizziness resolved before therapist could obtain BP. Educated pt and wife on use of RW for ambulating longer distances after noting increased lumbar lordosis and increased lateral weight shifting while pt ambulated w/ SPC. Pt and wife agree to using RW for most ambulation. Pt ambulated w/ RW back to room w/ S, pt left supine in bed w/ all needs within reach.   Therapy Documentation Precautions:  Precautions Precautions: Fall;Knee Precaution Comments: removed R KI for ambulation Required Braces or Orthoses: Knee Immobilizer - Right;Knee Immobilizer - Left Knee Immobilizer - Right: Discontinue once straight leg raise with < 10 degree lag Knee Immobilizer - Left: Discontinue once straight leg raise with < 10 degree lag Restrictions Weight Bearing Restrictions: No RLE Weight Bearing: Weight bearing as tolerated LLE Weight Bearing: Weight bearing as tolerated Other Position/Activity Restrictions: WBAT Pain: Pain Assessment Pain Assessment: 0-10 Pain Score: 5  Pain Type: Surgical pain Pain Location: Knee Pain Orientation: Right;Left Pain Descriptors / Indicators: Aching Pain Frequency: Constant Pain Onset: With Activity Patients Stated Pain Goal: 2 Pain Intervention(s):  (Pre-medicated prior to session) Multiple Pain Sites: No Locomotion : Ambulation Ambulation/Gait Assistance:  (MinGuard for SPC, close S for RW)   See FIM for current functional status  Therapy/Group: Individual Therapy  Rada Hay Rada Hay, PT, DPT 12/02/2013, 12:58 PM

## 2013-12-02 NOTE — Progress Notes (Signed)
Occupational Therapy Session Note  Patient Details  Name: Miguel Morris MRN: [id number] Date of Birth: 05/06/1950  Today's Date: 12/02/2013 Time: 830-930 Time Calculation (min): 60 min  Skilled Therapeutic Interventions/Progress Updates: Patient seen for shower in room and functional mobility via his RW with focus on dynamic and static standing balance.  He was able to reach all parts of his body for bathing and dressing with lateral leans for peri/buttuck cleansing on the wet transfer bench.  Patient stated, "I am trying to get up my courage to stand in this wet shower, but I have not gotten there for fear of the wet floor."  Patient with very high endurance and positive affect and positive and humorous comments.    Therapy Documentation Precautions:  Precautions Precautions: Fall;Knee Precaution Comments: removed R KI for ambulation Required Braces or Orthoses: Knee Immobilizer - Right;Knee Immobilizer - Left Knee Immobilizer - Right: Discontinue once straight leg raise with < 10 degree lag Knee Immobilizer - Left: Discontinue once straight leg raise with < 10 degree lag Restrictions Weight Bearing Restrictions: No RLE Weight Bearing: Weight bearing as tolerated LLE Weight Bearing: Weight bearing as tolerated Other Position/Activity Restrictions: WBAT     Pain:denied     Therapy/Group: Individual Therapy  Alfredia Ferguson [facility] 12/02/2013, 10:24 AM

## 2013-12-03 ENCOUNTER — Inpatient Hospital Stay (HOSPITAL_COMMUNITY): Payer: Managed Care, Other (non HMO)

## 2013-12-03 DIAGNOSIS — IMO0002 Reserved for concepts with insufficient information to code with codable children: Secondary | ICD-10-CM

## 2013-12-03 DIAGNOSIS — Z96659 Presence of unspecified artificial knee joint: Secondary | ICD-10-CM

## 2013-12-03 DIAGNOSIS — M1710 Unilateral primary osteoarthritis, unspecified knee: Secondary | ICD-10-CM

## 2013-12-03 LAB — CBC
HCT: 25.6 % — ABNORMAL LOW (ref 39.0–52.0)
HEMOGLOBIN: 8.4 g/dL — AB (ref 13.0–17.0)
MCH: 27.2 pg (ref 26.0–34.0)
MCHC: 32.8 g/dL (ref 30.0–36.0)
MCV: 82.8 fL (ref 78.0–100.0)
Platelets: 423 10*3/uL — ABNORMAL HIGH (ref 150–400)
RBC: 3.09 MIL/uL — AB (ref 4.22–5.81)
RDW: 15.9 % — ABNORMAL HIGH (ref 11.5–15.5)
WBC: 6.2 10*3/uL (ref 4.0–10.5)

## 2013-12-03 MED ORDER — NYSTATIN 100000 UNIT/GM EX POWD: 1.0000 g | Freq: Three times a day (TID) | CUTANEOUS | Status: DC

## 2013-12-03 MED ORDER — OXYCODONE-ACETAMINOPHEN 5-325 MG PO TABS: 1.0000 | ORAL_TABLET | ORAL | Status: DC | PRN

## 2013-12-03 MED ORDER — ZOLPIDEM TARTRATE 5 MG PO TABS: 5.0000 mg | ORAL_TABLET | Freq: Every evening | ORAL | Status: DC | PRN

## 2013-12-03 MED ORDER — RIVAROXABAN 15 MG PO TABS: 15.0000 mg | ORAL_TABLET | Freq: Two times a day (BID) | ORAL | Status: DC

## 2013-12-03 MED ORDER — FERROUS SULFATE 325 (65 FE) MG PO TABS: 325.0000 mg | ORAL_TABLET | Freq: Three times a day (TID) | ORAL | Status: DC

## 2013-12-03 MED ORDER — CAMPHOR-MENTHOL 0.5-0.5 % EX LOTN: TOPICAL_LOTION | Freq: Two times a day (BID) | CUTANEOUS | Status: DC

## 2013-12-03 MED ORDER — RIVAROXABAN 10 MG PO TABS: 10.0000 mg | ORAL_TABLET | Freq: Every day | ORAL | Status: DC

## 2013-12-03 MED ORDER — METHOCARBAMOL 500 MG PO TABS: 500.0000 mg | ORAL_TABLET | Freq: Four times a day (QID) | ORAL | Status: DC | PRN

## 2013-12-03 MED ORDER — OXYCODONE-ACETAMINOPHEN 10-325 MG PO TABS: 1.0000 | ORAL_TABLET | Freq: Four times a day (QID) | ORAL | Status: DC | PRN

## 2013-12-03 NOTE — Progress Notes (Signed)
Physical Therapy Discharge Summary  Patient Details  Name: Miguel Morris MRN: [id number] Date of Birth: March 14, 1951  Today's Date: 12/03/2013 Time: 0727-0827 Time Calculation (min): 60 min  Skilled Intervention: Ambulated >200' in controlled environment, 50' in simulated home environment (carpeted surface, obstacles) all w/ RW w/ mod (I). Negotiated 6 stairs w/ S, 1 rail and SPC. Car transfers w/ S. Furniture transfers w/ mod (I) in ADL apartment. Pt independently recalled and demonstrated HEP.  Patient has met 7 of 8 long term goals due to improved activity tolerance, improved balance, increased strength, increased range of motion and decreased pain.  Patient to discharge at an ambulatory level Modified Independent with supervision for stairs and car transfers.   Patient's care partner is independent to provide the necessary supervision assistance at discharge.  Reasons goals not met: Pt did not meet goal of 100 degrees knee flexion, achieved 94 degrees on B knees  Recommendation:  Patient will benefit from ongoing skilled PT services in outpatient setting to continue to advance safe functional mobility, address ongoing impairments in knee strength/ROM, gait impairments, pain control, and minimize fall risk.  Equipment: cane for stair negotiation  Reasons for discharge: treatment goals met and discharge from hospital  Patient/family agrees with progress made and goals achieved: Yes  PT Discharge Precautions/Restrictions Precautions Precautions: Fall;Knee Required Braces or Orthoses: Knee Immobilizer - Right;Knee Immobilizer - Left Knee Immobilizer - Right: Discontinue once straight leg raise with < 10 degree lag (8 deg lag noted 8/17) Knee Immobilizer - Left: Discontinue once straight leg raise with < 10 degree lag (18 deg lag noted 8/17) Restrictions Weight Bearing Restrictions: No RLE Weight Bearing: Weight bearing as tolerated LLE Weight Bearing: Weight bearing as  tolerated Vital Signs   Pain Pain Assessment Pain Assessment: No/denies pain Pain Score: 0-No pain Faces Pain Scale: Hurts a little bit Pain Type: Surgical pain Pain Location: Knee Pain Orientation: Right;Left Pain Descriptors / Indicators: Aching Pain Onset: Gradual Patients Stated Pain Goal: 0 Pain Intervention(s): Medication (See eMAR) Vision/Perception  Perception Comments: WFL  Cognition Overall Cognitive Status: Within Functional Limits for tasks assessed Arousal/Alertness: Awake/alert Orientation Level: Oriented X4 Attention: Divided Divided Attention: Appears intact Memory: Appears intact Awareness: Appears intact Problem Solving: Appears intact Safety/Judgment: Appears intact Sensation Sensation Light Touch: Appears Intact Stereognosis: Appears Intact Hot/Cold: Appears Intact Proprioception: Appears Intact Coordination Gross Motor Movements are Fluid and Coordinated: Yes Fine Motor Movements are Fluid and Coordinated: Yes Coordination and Movement Description: stiffness in B knees Motor  Motor Motor: Within Functional Limits Motor - Skilled Clinical Observations: biltateral LE weakness 2/2 TKAs  Mobility Bed Mobility Bed Mobility: Supine to Sit;Sit to Supine Supine to Sit: 6: Modified independent (Device/Increase time) Sit to Supine: 6: Modified independent (Device/Increase time) Transfers Sit to Stand: 6: Modified independent (Device/Increase time) Stand to Sit: 6: Modified independent (Device/Increase time) Locomotion  Ambulation Ambulation: Yes Ambulation/Gait Assistance: 6: Modified independent (Device/Increase time) Ambulation Distance (Feet): 200 Feet Assistive device: Rolling walker Gait Gait: Yes Gait Pattern: Decreased step length - right;Decreased step length - left Gait velocity: decreased knee flexion bilaterally Stairs / Additional Locomotion Stairs: Yes Stairs Assistance: 5: Supervision Stair Management Technique: One rail  Right Number of Stairs: 5  Trunk/Postural Assessment  Cervical Assessment Cervical Assessment: Within Functional Limits Thoracic Assessment Thoracic Assessment: Within Functional Limits Lumbar Assessment Lumbar Assessment: Within Functional Limits Postural Control Postural Control: Within Functional Limits  Balance Balance Balance Assessed: Yes Static Sitting Balance Static Sitting - Balance Support: Feet unsupported;No upper extremity supported Static  Sitting - Level of Assistance: 7: Independent Dynamic Sitting Balance Dynamic Sitting - Balance Support: Feet unsupported;During functional activity;No upper extremity supported Dynamic Sitting - Level of Assistance: 7: Independent Static Standing Balance Static Standing - Balance Support: During functional activity Static Standing - Level of Assistance: 6: Modified independent (Device/Increase time) Dynamic Standing Balance Dynamic Standing - Balance Support: No upper extremity supported;During functional activity Dynamic Standing - Level of Assistance: 6: Modified independent (Device/Increase time) Dynamic Standing - Balance Activities: Lateral lean/weight shifting;Forward lean/weight shifting;Reaching across midline Extremity Assessment  RUE Assessment RUE Assessment: Within Functional Limits LUE Assessment LUE Assessment: Within Functional Limits RLE AROM (degrees) Right Knee Extension: 10 Right Knee Flexion: 80 RLE PROM (degrees) Right Knee Extension: 5 Right Knee Flexion: 94 RLE Strength RLE Overall Strength: Deficits RLE Overall Strength Comments: Knee flex/ext limited 2/2 TKA LLE AROM (degrees) Left Knee Extension: 12 Left Knee Flexion: 85 LLE PROM (degrees) Left Knee Extension: 5 Left Knee Flexion: 94 LLE Strength LLE Overall Strength Comments: Knee flex/ext limited 2/2 TKA  See FIM for current functional status  Rada Hay 12/03/2013, 9:26 AM

## 2013-12-03 NOTE — Plan of Care (Addendum)
Problem: RH Other (Specify) Goal: RH LTG Other (Specify) Outcome: Not Met (add Reason) Pt achieved 94 degrees flexion B knees

## 2013-12-03 NOTE — Progress Notes (Addendum)
Subjective/Complaints: Knee flexion still lagging. Pain controlled.  Doing extremely well with therapy. Anxious to go home  Objective: Vital Signs: Blood pressure 136/77, pulse 57, temperature 98.4 F (36.9 C), temperature source Oral, resp. rate 16, height 6' (1.829 m), weight 83.1 kg (183 lb 3.2 oz), SpO2 97.00%. No results found. Results for orders placed during the hospital encounter of 11/27/13 (from the past 72 hour(s))  CBC     Status: Abnormal   Collection Time    12/03/13  6:20 AM      Result Value Ref Range   WBC 6.2  4.0 - 10.5 K/uL   RBC 3.09 (*) 4.22 - 5.81 MIL/uL   Hemoglobin 8.4 (*) 13.0 - 17.0 g/dL   HCT 25.6 (*) 39.0 - 52.0 %   MCV 82.8  78.0 - 100.0 fL   MCH 27.2  26.0 - 34.0 pg   MCHC 32.8  30.0 - 36.0 g/dL   RDW 15.9 (*) 11.5 - 15.5 %   Platelets 423 (*) 150 - 400 K/uL     HEENT: normal Cardio: RRR and No Murmur Resp: CTA B/L and unlabored GI: BS positive and NT, ND Extremity:  Pulses positive and No Edema  Skin:   Wound C/D/I with staples.  No drainage Neuro: Alert/Oriented Musc/Skel:  Extremity tender mild calf tenderness and Swelling bilateral LEs. knee flexion only 75-85 degrees Gen NAD   Assessment/Plan: 1. Functional deficits secondary to Bilateral Knee OA end stage s/p TKR which require 3+ hours per day of interdisciplinary therapy in a comprehensive inpatient rehab setting. Physiatrist is providing close team supervision and 24 hour management of active medical problems listed below. Physiatrist and rehab team continue to assess barriers to discharge/monitor patient progress toward functional and medical goals.  Home today, will need home cpm machines to achieve better ROM---still only 80 degrees   FIM: FIM - Bathing Bathing Steps Patient Completed: Chest;Right Arm;Left Arm;Abdomen;Front perineal area;Buttocks;Right upper leg;Left upper leg;Right lower leg (including foot);Left lower leg (including foot) Bathing: 6: Assistive device (Comment)  (LH sponge)  FIM - Upper Body Dressing/Undressing Upper body dressing/undressing steps patient completed: Thread/unthread right sleeve of pullover shirt/dresss;Thread/unthread left sleeve of pullover shirt/dress;Put head through opening of pull over shirt/dress;Pull shirt over trunk Upper body dressing/undressing: 7: Complete Independence: No helper FIM - Lower Body Dressing/Undressing Lower body dressing/undressing steps patient completed: Thread/unthread right pants leg;Thread/unthread left pants leg;Pull pants up/down;Fasten/unfasten pants Lower body dressing/undressing: 6: More than reasonable amount of time  FIM - Toileting Toileting steps completed by patient: Adjust clothing prior to toileting;Performs perineal hygiene;Adjust clothing after toileting Toileting Assistive Devices: Grab bar or rail for support Toileting: 6: More than reasonable amount of time  FIM - Radio producer Devices: Nurse, learning disability Transfers: 6-To toilet/ BSC;6-From toilet/BSC  FIM - Control and instrumentation engineer Devices: Copy: 6: Supine > Sit: No assist;6: Bed > Chair or W/C: No assist;6: Chair or W/C > Bed: No assist  FIM - Locomotion: Wheelchair Distance: 200 Locomotion: Wheelchair: 0: Activity did not occur FIM - Locomotion: Ambulation Locomotion: Ambulation Assistive Devices: Social worker - Chief Technology Officer Assistance:  (MinGuard for SPC, close S for RW) Locomotion: Ambulation: 4: Travels 150 ft or more with minimal assistance (Pt.>75%)  Comprehension Comprehension Mode: Auditory Comprehension: 7-Follows complex conversation/direction: With no assist  Expression Expression Mode: Verbal Expression: 7-Expresses complex ideas: With no assist  Social Interaction Social Interaction: 7-Interacts appropriately with others - No medications needed.  Problem Solving Problem Solving: 7-Solves complex  problems: Recognizes & self-corrects  Memory Memory: 7-Complete Independence: No helper   Medical Problem List and Plan:  1. Functional deficits secondary to bilateral total knee arthroplasties 11/23/2013 secondary to end-stage osteoarthritis  2. DVT bilateral calf: now on full dose Xarelto, no bleeding  - recheck doppler in a month to help guide further treatment 3. Pain Management: Oxycodone and Robaxin as needed. Monitor with increased mobility  4. Mood/depression: Prozac 20 mg daily. Provide emotional support  5. Neuropsych: This patient is capable of making decisions on his own behalf.  6. Skin/Wound Care: Routine skin care/monitor bilateral knee incisions   -can place dry dressing over wounds for comfort  -dc staples day #14 7. Acute blood loss anemia. hgb has plateaued at 8.4  -fe supp 8. Constipation. Resolved using dried fruit   LOS (Days) 6 A FACE TO FACE EVALUATION WAS PERFORMED  Kaydense Rizo T 12/03/2013, 8:34 AM

## 2013-12-03 NOTE — Progress Notes (Signed)
Occupational Therapy Session and Discharge Summary  Patient Details  Name: Miguel Morris MRN: [id number] Date of Birth: Nov 23, 1950  Today's Date: 12/03/2013 Time: 0727-0827 Time Calculation (min): 60 min  Skilled Interventions: ADL-retraining with emphasis on patient awareness of discharge recommendations and review of use of AE and DME.   Pt retains skills with use of LH sponge for bathing, leg lifter, shower chair and BSC overt toilet.   Per patient, using a small stool to elevate his right leg slightly reduces effort during BM.   Pt completed all ADL this session, sitting and standing, ambulating with his RW with no evidence of LOB and minimal discomfort.  Patient prefers bathing seated in bath bench and will use a shower chair in his walk-in shower when he returns.  Extension lag assessed at bilateral knees before and after showering as follows: R = 8 deg lag, L=18-20 deg lag.   Patient reports no need for use of reacher anymore and he is able to don/doff TEDs independently due to excellent flexibility at bilateral hips/hamstrings.     Patient has met 8 of 8 long term goals due to improved activity tolerance, improved balance and ability to compensate for deficits.  Patient to discharge at overall Modified Independent level.  Patient's care partner is independent to provide the necessary physical assistance at discharge to supervise patient during his attempt to complete meal prep, if desired.    Reasons goals not met: n/a  Recommendation:  Patient will not require from ongoing skilled OT services s/p discharge.  Equipment: shower chair  Reasons for discharge: treatment goals met  Patient/family agrees with progress made and goals achieved: Yes  OT Discharge Precautions/Restrictions  Precautions Precautions: Fall;Knee Required Braces or Orthoses: Knee Immobilizer - Right;Knee Immobilizer - Left Knee Immobilizer - Right: Discontinue once straight leg raise with < 10 degree lag (8 deg  lag noted 8/17) Knee Immobilizer - Left: Discontinue once straight leg raise with < 10 degree lag (18 deg lag noted 8/17) Restrictions Weight Bearing Restrictions: No RLE Weight Bearing: Weight bearing as tolerated LLE Weight Bearing: Weight bearing as tolerated  Vital Signs Therapy Vitals Temp: 98.4 F (36.9 C) Temp src: Oral Pulse Rate: 57 Resp: 16 BP: 136/77 mmHg Patient Position (if appropriate): Sitting Oxygen Therapy SpO2: 97 % O2 Device: None (Room air)  Pain Pain Assessment Pain Assessment: No/denies pain Pain Score: 5  Faces Pain Scale: Hurts a little bit Pain Type: Surgical pain Pain Location: Knee Pain Orientation: Right;Left Pain Descriptors / Indicators: Aching Pain Onset: Gradual Patients Stated Pain Goal: 0 Pain Intervention(s): Medication (See eMAR)  ADL ADL ADL Comments: see FIM  Vision/Perception  Vision- History Baseline Vision/History: Wears glasses Wears Glasses: Reading only Patient Visual Report: No change from baseline Vision- Assessment Vision Assessment?: No apparent visual deficits Perception Comments: WFL   Cognition Overall Cognitive Status: Within Functional Limits for tasks assessed Arousal/Alertness: Awake/alert Attention: Divided Divided Attention: Appears intact Memory: Appears intact Awareness: Appears intact Problem Solving: Appears intact Safety/Judgment: Appears intact  Sensation Sensation Light Touch: Appears Intact Stereognosis: Appears Intact Hot/Cold: Appears Intact Proprioception: Appears Intact Coordination Gross Motor Movements are Fluid and Coordinated: Yes Fine Motor Movements are Fluid and Coordinated: Yes Coordination and Movement Description: residual stiffness at knees due to B TKAs  Motor  Motor Motor: Within Functional Limits Motor - Skilled Clinical Observations: biltateral LE weakness  Mobility  Bed Mobility Bed Mobility: Supine to Sit;Sit to Supine Supine to Sit: 6: Modified independent  (Device/Increase time) Sit to Supine: 6:  Modified independent (Device/Increase time) Transfers Transfers: Sit to Stand;Stand to Sit Sit to Stand: 6: Modified independent (Device/Increase time) Stand to Sit: 6: Modified independent (Device/Increase time)   Trunk/Postural Assessment  Cervical Assessment Cervical Assessment: Within Functional Limits Thoracic Assessment Thoracic Assessment: Within Functional Limits Lumbar Assessment Lumbar Assessment: Within Functional Limits Postural Control Postural Control: Within Functional Limits   Balance Balance Balance Assessed: Yes Static Sitting Balance Static Sitting - Balance Support: Feet unsupported;No upper extremity supported Static Sitting - Level of Assistance: 7: Independent Dynamic Sitting Balance Dynamic Sitting - Balance Support: Feet unsupported;During functional activity;No upper extremity supported Dynamic Sitting - Level of Assistance: 7: Independent Static Standing Balance Static Standing - Balance Support: During functional activity Static Standing - Level of Assistance: 6: Modified independent (Device/Increase time) Dynamic Standing Balance Dynamic Standing - Balance Support: During functional activity Dynamic Standing - Level of Assistance: 6: Modified independent (Device/Increase time) Dynamic Standing - Balance Activities: Reaching across midline;Reaching for objects;Forward lean/weight shifting;Lateral lean/weight shifting  Extremity/Trunk Assessment RUE Assessment RUE Assessment: Within Functional Limits LUE Assessment LUE Assessment: Within Functional Limits  See FIM for current functional status  Seltzer 12/03/2013, 8:44 AM

## 2013-12-04 ENCOUNTER — Ambulatory Visit: Payer: Managed Care, Other (non HMO) | Attending: Physical Medicine & Rehabilitation | Admitting: Physical Therapy

## 2013-12-04 DIAGNOSIS — M25569 Pain in unspecified knee: Secondary | ICD-10-CM | POA: Diagnosis not present

## 2013-12-04 DIAGNOSIS — M25669 Stiffness of unspecified knee, not elsewhere classified: Secondary | ICD-10-CM | POA: Diagnosis not present

## 2013-12-04 DIAGNOSIS — Z96659 Presence of unspecified artificial knee joint: Secondary | ICD-10-CM | POA: Diagnosis not present

## 2013-12-04 DIAGNOSIS — IMO0001 Reserved for inherently not codable concepts without codable children: Secondary | ICD-10-CM | POA: Diagnosis not present

## 2013-12-04 NOTE — Patient Care Conference (Signed)
Inpatient RehabilitationTeam Conference and Plan of Care Update Date: 12/03/2013   Time: 9:38 AM    Patient Name: Miguel Morris      Medical Record Number: [id number]  Date of Birth: Sep 11, 1950 Sex: Male         Room/Bed: 4W06C/4W06C-01 Payor Info: Payor: [employer] / Plan: Market researcher / Product Type: *No Product type* /    Admitting Diagnosis: TKA  Admit Date/Time:  11/27/2013  6:52 PM Admission Comments: No comment available   Primary Diagnosis:  <principal problem not specified> Principal Problem: <principal problem not specified>  Patient Active Problem List   Diagnosis Date Noted  . Status post bilateral knee replacements 11/27/2013  . Degenerative arthritis of knee, bilateral 11/23/2013  . Status post total bilateral knee replacement 11/23/2013    Expected Discharge Date: Expected Discharge Date: 12/03/13  Team Members Present: Physician leading conference: Dr. Alger Simons Social Worker Present: Lennart Pall, LCSW Nurse Present: Rozetta Nunnery, RN PT Present: Rada Hay, PT OT Present: Salome Spotted, OT     Current Status/Progress Goal Weekly Team Focus  Medical   bilateral TKA's, bilateral calf dvt's  pain mgt, dvt rx  stabilize medically for dc   Bowel/Bladder   continent of bowel and bladder  independent  remain independent   Swallow/Nutrition/ Hydration             ADL's   mod I   mod I overall  d/c planning, activity tolerance and dynamic standing balance   Mobility   mod (I) w/ RW  mod I with RW  BLE strength, transfer/gait strategies, safe use of assistive devices   Communication             Safety/Cognition/ Behavioral Observations            Pain   pain controlled with prn meds at a level of 3 or less  pain less than or equal to 3 on a scale of 0-10  medicate for pain prior to therapy   Skin   skin free of s/s of infection/breakdown, incisions healing well, staples patent  maintain skin free from s/s of infection or breakdown  assess skin q shift  and prn    Rehab Goals Patient on target to meet rehab goals: Yes *See Care Plan and progress notes for long and short-term goals.  Barriers to Discharge: none    Possible Resolutions to Barriers:  n/a    Discharge Planning/Teaching Needs:  home with wife to assist as needed      Team Discussion:  Pt has made excellent gains and ready for d/c today  Revisions to Treatment Plan:  None   Continued Need for Acute Rehabilitation Level of Care: The patient requires daily medical management by a physician with specialized training in physical medicine and rehabilitation for the following conditions: Daily direction of a multidisciplinary physical rehabilitation program to ensure safe treatment while eliciting the highest outcome that is of practical value to the patient.: Yes Daily medical management of patient stability for increased activity during participation in an intensive rehabilitation regime.: Yes Daily analysis of laboratory values and/or radiology reports with any subsequent need for medication adjustment of medical intervention for : Cardiac problems;Pulmonary problems;Post surgical problems  Carlos Heber 12/03/2013, 9:38 AM

## 2013-12-10 NOTE — Progress Notes (Signed)
Social Work Discharge Note  The overall goal for the admission was met for:   Discharge location: Yes - Home  Length of Stay: Yes - 6 days  Discharge activity level: Yes - modified independent  Home/community participation: Yes  Services provided included: MD, RD, PT, OT, RN, Pharmacy and SW  Financial Services: Private Insurance: [employer]  Follow-up services arranged: Outpatient: [facility] on [address] and DME: CPM from [place] (or additional information):  Pt d/c'd to home with his wife.  He borrowed a rolling walker and a bedside commode from a neighbor and wife is buying a shower seat.  CPM through [place] verbalized understanding of follow-up arrangements: Yes  Individual responsible for coordination of the follow-up plan: pt with assistance as needed from wife  Confirmed correct DME delivered: Trey Sailors 12/10/2013    Petra Sargeant, Silvestre Mesi

## 2013-12-12 ENCOUNTER — Ambulatory Visit: Payer: Managed Care, Other (non HMO)

## 2013-12-12 DIAGNOSIS — IMO0001 Reserved for inherently not codable concepts without codable children: Secondary | ICD-10-CM | POA: Diagnosis not present

## 2013-12-17 ENCOUNTER — Ambulatory Visit: Payer: Managed Care, Other (non HMO)

## 2013-12-17 DIAGNOSIS — IMO0001 Reserved for inherently not codable concepts without codable children: Secondary | ICD-10-CM | POA: Diagnosis not present

## 2013-12-20 ENCOUNTER — Ambulatory Visit: Payer: Managed Care, Other (non HMO) | Attending: Physical Medicine & Rehabilitation | Admitting: Physical Therapy

## 2013-12-20 ENCOUNTER — Ambulatory Visit: Payer: Managed Care, Other (non HMO) | Admitting: Physical Therapy

## 2013-12-20 ENCOUNTER — Other Ambulatory Visit (HOSPITAL_COMMUNITY): Payer: Self-pay | Admitting: Physical Medicine and Rehabilitation

## 2013-12-20 DIAGNOSIS — M25569 Pain in unspecified knee: Secondary | ICD-10-CM | POA: Insufficient documentation

## 2013-12-20 DIAGNOSIS — IMO0001 Reserved for inherently not codable concepts without codable children: Secondary | ICD-10-CM | POA: Insufficient documentation

## 2013-12-20 DIAGNOSIS — M25669 Stiffness of unspecified knee, not elsewhere classified: Secondary | ICD-10-CM | POA: Insufficient documentation

## 2013-12-20 DIAGNOSIS — Z96659 Presence of unspecified artificial knee joint: Secondary | ICD-10-CM | POA: Insufficient documentation

## 2013-12-25 ENCOUNTER — Ambulatory Visit: Payer: Managed Care, Other (non HMO) | Admitting: Physical Therapy

## 2013-12-25 DIAGNOSIS — M25569 Pain in unspecified knee: Secondary | ICD-10-CM | POA: Diagnosis not present

## 2013-12-25 DIAGNOSIS — Z96659 Presence of unspecified artificial knee joint: Secondary | ICD-10-CM | POA: Diagnosis not present

## 2013-12-25 DIAGNOSIS — IMO0001 Reserved for inherently not codable concepts without codable children: Secondary | ICD-10-CM | POA: Diagnosis present

## 2013-12-25 DIAGNOSIS — M25669 Stiffness of unspecified knee, not elsewhere classified: Secondary | ICD-10-CM | POA: Diagnosis not present

## 2013-12-27 ENCOUNTER — Ambulatory Visit: Payer: Managed Care, Other (non HMO) | Admitting: Physical Therapy

## 2013-12-27 DIAGNOSIS — IMO0001 Reserved for inherently not codable concepts without codable children: Secondary | ICD-10-CM | POA: Diagnosis not present

## 2014-02-19 ENCOUNTER — Other Ambulatory Visit: Payer: Self-pay | Admitting: Family Medicine

## 2014-02-19 DIAGNOSIS — I82403 Acute embolism and thrombosis of unspecified deep veins of lower extremity, bilateral: Secondary | ICD-10-CM

## 2014-03-03 ENCOUNTER — Other Ambulatory Visit: Payer: Self-pay | Admitting: Physical Medicine and Rehabilitation

## 2014-03-04 ENCOUNTER — Ambulatory Visit
Admission: RE | Admit: 2014-03-04 | Discharge: 2014-03-04 | Disposition: A | Discharge status: Home / Self Care | Payer: Managed Care, Other (non HMO) | Source: Ambulatory Visit | Attending: Family Medicine | Admitting: Family Medicine

## 2014-03-04 DIAGNOSIS — I82403 Acute embolism and thrombosis of unspecified deep veins of lower extremity, bilateral: Secondary | ICD-10-CM

## 2014-03-08 ENCOUNTER — Ambulatory Visit (HOSPITAL_COMMUNITY)
Admission: RE | Admit: 2014-03-08 | Discharge: 2014-03-08 | Disposition: A | Discharge status: Home / Self Care | Payer: Managed Care, Other (non HMO) | Source: Ambulatory Visit | Attending: Specialist | Admitting: Specialist

## 2014-03-08 ENCOUNTER — Other Ambulatory Visit (HOSPITAL_COMMUNITY): Payer: Self-pay | Admitting: Specialist

## 2014-03-08 DIAGNOSIS — I82409 Acute embolism and thrombosis of unspecified deep veins of unspecified lower extremity: Secondary | ICD-10-CM

## 2014-03-08 DIAGNOSIS — R609 Edema, unspecified: Secondary | ICD-10-CM

## 2014-03-08 NOTE — Progress Notes (Signed)
*  PRELIMINARY RESULTS* Vascular Ultrasound Lower extremity venous duplex has been completed.  Preliminary findings: No evidence of DVT bilaterally.  Unchanged from study on 03/04/14.  Called results to Dr. Louanne Skye. Patient instructed to go to his office today.   Landry Mellow, RDMS, RVT  03/08/2014, 9:55 AM

## 2014-03-19 ENCOUNTER — Other Ambulatory Visit: Payer: Self-pay | Admitting: Family Medicine

## 2014-03-19 DIAGNOSIS — I82403 Acute embolism and thrombosis of unspecified deep veins of lower extremity, bilateral: Secondary | ICD-10-CM

## 2016-01-08 ENCOUNTER — Other Ambulatory Visit (HOSPITAL_COMMUNITY): Payer: Self-pay | Admitting: Urology

## 2016-01-08 DIAGNOSIS — C61 Malignant neoplasm of prostate: Secondary | ICD-10-CM

## 2016-02-06 ENCOUNTER — Ambulatory Visit (HOSPITAL_COMMUNITY): Admission: RE | Admit: 2016-02-06 | Payer: Medicare HMO | Source: Ambulatory Visit

## 2016-02-07 ENCOUNTER — Ambulatory Visit (HOSPITAL_COMMUNITY)
Admission: RE | Admit: 2016-02-07 | Discharge: 2016-02-07 | Disposition: A | Discharge status: Home / Self Care | Payer: Medicare HMO | Source: Ambulatory Visit | Attending: Urology | Admitting: Urology

## 2016-02-07 DIAGNOSIS — R590 Localized enlarged lymph nodes: Secondary | ICD-10-CM | POA: Insufficient documentation

## 2016-02-07 DIAGNOSIS — C61 Malignant neoplasm of prostate: Secondary | ICD-10-CM | POA: Insufficient documentation

## 2016-02-07 LAB — POCT I-STAT CREATININE: Creatinine, Ser: 0.8 mg/dL (ref 0.61–1.24)

## 2016-02-07 MED ORDER — GADOBENATE DIMEGLUMINE 529 MG/ML IV SOLN
18.0000 mL | Freq: Once | INTRAVENOUS | Status: AC | PRN
  Administered 2016-02-07: 18 mL via INTRAVENOUS

## 2016-04-07 ENCOUNTER — Ambulatory Visit (INDEPENDENT_AMBULATORY_CARE_PROVIDER_SITE_OTHER): Payer: Self-pay | Admitting: Physician Assistant

## 2016-04-22 ENCOUNTER — Telehealth: Payer: Self-pay

## 2016-04-22 NOTE — Telephone Encounter (Signed)
SENT NOTES TO SCHEDULING 

## 2016-04-28 ENCOUNTER — Ambulatory Visit (INDEPENDENT_AMBULATORY_CARE_PROVIDER_SITE_OTHER): Payer: Medicare HMO | Admitting: Orthopaedic Surgery

## 2016-04-28 ENCOUNTER — Ambulatory Visit (INDEPENDENT_AMBULATORY_CARE_PROVIDER_SITE_OTHER): Payer: Medicare HMO

## 2016-04-28 DIAGNOSIS — Z96653 Presence of artificial knee joint, bilateral: Secondary | ICD-10-CM

## 2016-04-28 DIAGNOSIS — M25551 Pain in right hip: Secondary | ICD-10-CM

## 2016-04-28 DIAGNOSIS — M1611 Unilateral primary osteoarthritis, right hip: Secondary | ICD-10-CM | POA: Diagnosis not present

## 2016-04-28 DIAGNOSIS — M25561 Pain in right knee: Secondary | ICD-10-CM

## 2016-04-28 NOTE — Progress Notes (Signed)
Office Visit Note   Patient: Miguel Morris           Date of Birth: [birth year]           MRN: [id number] Visit Date: 04/28/2016              Requested by: Hulan Fess, MD [place], [place] [address] PCP: Gennette Pac, MD   Assessment & Plan: Visit Diagnoses:  1. Acute pain of right knee   2. Pain of right hip joint   3. Status post bilateral knee replacements   4. Unilateral primary osteoarthritis, right hip     Plan: I share with him and his hip x-rays. He has profound end-stage arthritis of his right hip. There is really no other treatment options at this point other than doing nothing versus a total hip replacement. There is no joint space remaining 2 have any effect of a steroid injection. He has already work on activity modification. Now his pain is daily. He walks with a limp. His pain is detrimentally affected his activities of daily living, his quality of life, and his mobility. At this point we had a long discussion about hip replaced surgery. We showed him have models and explained in detail with this surgery involves including a thorough discussion of risk medicine surgery. He does wish proceed with this sometime in the near future. We would then see him back in 2 weeks postoperative but no x-rays of be needed.  Follow-Up Instructions: Return if symptoms worsen or fail to improve, for 2 weeks post-op.   Orders:  Orders Placed This Encounter  Procedures  . XR Knee 1-2 Views Right  . XR Pelvis 1-2 Views   No orders of the defined types were placed in this encounter.     Procedures: No procedures performed   Clinical Data: No additional findings.   Subjective: Chief Complaint  Patient presents with  . Right Knee - Pain    Pain for quite sometime. Restless leg since surgery. Still some painful ROM    HPI He comes in a chief complaint today of right knee pain and he says it may be more of his hip. We performed bilateral knee replacements  on him 2 years ago he says the knees himself are doing well. His wife is with him and says she he asked he walks with significant limp. He has trouble getting his pants on and off as well as socket shoe on his right side. He says his knees have been doing okay but he would like x-rays of his knees today. Based on exam that would set x-ray the hip as well. Review of Systems He denies any chest pain, shortness of breath, fever, chills, nausea, vomiting.  Objective: Vital Signs: There were no vitals taken for this visit.  Physical Exam He is alert and oriented 3. Ortho Exam Examination of both knees show no effusion of either knee. Both incisions of the healed nicely. He has full range of motion of both knees. They're both ligamentously stable. His leg lengths are equal. His left hip exam is normal. The right hip has pain with internal rotation rotation and actually significantly decreased internal and external rotation compared to his left side. Specialty Comments:  No specialty comments available.  Imaging: Xr Knee 1-2 Views Right  Result Date: 04/28/2016 An AP and lateral of the right knee are obtained and using x-rays AP of both knees on that view. I seen no, getting features of either  knee. The implants appear well seated with good alignment. There is no effusion and noticed a fracture of either knee.  Xr Pelvis 1-2 Views  Result Date: 04/28/2016 AP pelvis shows severe osteoarthritis of his right hip. There is significant sclerotic changes. There is cystic changes and acetabulum. There is complete joint space loss of the superior lateral aspect. This particular osteophytes as well.    PMFS History: Patient Active Problem List   Diagnosis Date Noted  . Status post bilateral knee replacements 11/27/2013  . Degenerative arthritis of knee, bilateral 11/23/2013  . Status post total bilateral knee replacement 11/23/2013   Past Medical History:  Diagnosis Date  . Arthritis    oa and  pain both knees  . GERD (gastroesophageal reflux disease)    TUMS EVERY NIGHT  . Heart murmur    STATES BORN WITH HEART MURMUR BUT NO LONGER HAS THE MURMUR  . Prostate enlargement    HX OF ELEVATED PSA - DR. TANNENBAUM IS PT'[S UROLOGIST    No family history on file.  Past Surgical History:  Procedure Laterality Date  . BROKEN CLAVICLE 1988    . TONSILLECTOMY     AS A CHILD  . TOTAL KNEE ARTHROPLASTY Bilateral 11/23/2013   Procedure: TOTAL KNEE BILATERAL;  Surgeon: Mcarthur Rossetti, MD;  Location: [facility];  Service: Orthopedics;  Laterality: Bilateral;   Social History   Occupational History  . Not on file.   Social History Main Topics  . Smoking status: Former Research scientist (life sciences)  . Smokeless tobacco: Never Used  . Alcohol use Yes     Comment: QUIT SMOKING 33 YRS AGO;  2 TO 4 BEERS A NIGHT  . Drug use: No  . Sexual activity: Not on file

## 2016-05-11 NOTE — Progress Notes (Signed)
Need orders in EPIC preop on 1/24 at 1100am.  Surgery on 2/2.

## 2016-05-11 NOTE — Progress Notes (Signed)
Please place orders in EPIC as patient is being scheduled for a pre-op appointment for surgery! Thank you!

## 2016-05-11 NOTE — Patient Instructions (Addendum)
Christapher Welser.  05/12/2016   Your procedure is scheduled on: 05/21/2016    Report to [facility] Main  Entrance take [place]  elevators to 3rd floor to  [place] at    [place] AM.  Call this number if you have problems the morning of surgery [phone]   Remember: ONLY 1 PERSON MAY GO WITH YOU TO SHORT STAY TO GET  READY MORNING OF [place].  Do not eat food or drink liquids :After Midnight.     Take these medicines the morning of surgery with A SIP OF WATER: Amilodipine(norvasc), prozac DO NOT TAKE ANY DIABETIC MEDICATIONS DAY OF YOUR SURGERY                               You may not have any metal on your body including hair pins and              piercings  Do not wear jewelry,  lotions, powders or perfumes, deodorant                         Men may shave face and neck.   Do not bring valuables to the hospital. [place].  Contacts, dentures or bridgework may not be worn into surgery.  Leave suitcase in the car. After surgery it may be brought to your room.                     Please read over the following fact sheets you were given: _____________________________________________________________________             [facility] - Preparing for Surgery Before surgery, you can play an important role.  Because skin is not sterile, your skin needs to be as free of germs as possible.  You can reduce the number of germs on your skin by washing with CHG (chlorahexidine gluconate) soap before surgery.  CHG is an antiseptic cleaner which kills germs and bonds with the skin to continue killing germs even after washing. Please DO NOT use if you have an allergy to CHG or antibacterial soaps.  If your skin becomes reddened/irritated stop using the CHG and inform your nurse when you arrive at Short Stay. Do not shave (including legs and underarms) for at least 48 hours prior to the first CHG shower.  You  may shave your face/neck. Please follow these instructions carefully:  1.  Shower with CHG Soap the night before surgery and the  morning of Surgery.  2.  If you choose to wash your hair, wash your hair first as usual with your  normal  shampoo.  3.  After you shampoo, rinse your hair and body thoroughly to remove the  shampoo.                           4.  Use CHG as you would any other liquid soap.  You can apply chg directly  to the skin and wash                       Gently with a scrungie or clean washcloth.  5.  Apply the CHG Soap to your body ONLY FROM THE NECK DOWN.   Do not use on face/ open                           Wound or open sores. Avoid contact with eyes, ears mouth and genitals (private parts).                       Wash face,  Genitals (private parts) with your normal soap.             6.  Wash thoroughly, paying special attention to the area where your surgery  will be performed.  7.  Thoroughly rinse your body with warm water from the neck down.  8.  DO NOT shower/wash with your normal soap after using and rinsing off  the CHG Soap.                9.  Pat yourself dry with a clean towel.            10.  Wear clean pajamas.            11.  Place clean sheets on your bed the night of your first shower and do not  sleep with pets. Day of Surgery : Do not apply any lotions/deodorants the morning of surgery.  Please wear clean clothes to the hospital/surgery center.  FAILURE TO FOLLOW THESE INSTRUCTIONS MAY RESULT IN THE CANCELLATION OF YOUR SURGERY PATIENT SIGNATURE_________________________________  NURSE SIGNATURE__________________________________  ________________________________________________________________________  WHAT IS A BLOOD TRANSFUSION? Blood Transfusion Information  A transfusion is the replacement of blood or some of its parts. Blood is made up of multiple cells which provide different functions.  Red blood cells carry oxygen and are used for blood loss  replacement.  White blood cells fight against infection.  Platelets control bleeding.  Plasma helps clot blood.  Other blood products are available for specialized needs, such as hemophilia or other clotting disorders. BEFORE THE TRANSFUSION  Who gives blood for transfusions?   Healthy volunteers who are fully evaluated to make sure their blood is safe. This is blood bank blood. Transfusion therapy is the safest it has ever been in the practice of medicine. Before blood is taken from a donor, a complete history is taken to make sure that person has no history of diseases nor engages in risky social behavior (examples are intravenous drug use or sexual activity with multiple partners). The donor's travel history is screened to minimize risk of transmitting infections, such as malaria. The donated blood is tested for signs of infectious diseases, such as HIV and hepatitis. The blood is then tested to be sure it is compatible with you in order to minimize the chance of a transfusion reaction. If you or a relative donates blood, this is often done in anticipation of surgery and is not appropriate for emergency situations. It takes many days to process the donated blood. RISKS AND COMPLICATIONS Although transfusion therapy is very safe and saves many lives, the main dangers of transfusion include:   Getting an infectious disease.  Developing a transfusion reaction. This is an allergic reaction to something in the blood you were given. Every precaution is taken to prevent this. The decision to have a blood transfusion has been considered carefully by your caregiver before blood is given. Blood is not given unless the benefits outweigh the risks. AFTER THE TRANSFUSION  Right after receiving a  blood transfusion, you will usually feel much better and more energetic. This is especially true if your red blood cells have gotten low (anemic). The transfusion raises the level of the red blood cells which  carry oxygen, and this usually causes an energy increase.  The nurse administering the transfusion will monitor you carefully for complications. HOME CARE INSTRUCTIONS  No special instructions are needed after a transfusion. You may find your energy is better. Speak with your caregiver about any limitations on activity for underlying diseases you may have. SEEK MEDICAL CARE IF:   Your condition is not improving after your transfusion.  You develop redness or irritation at the intravenous (IV) site. SEEK IMMEDIATE MEDICAL CARE IF:  Any of the following symptoms occur over the next 12 hours:  Shaking chills.  You have a temperature by mouth above 102 F (38.9 C), not controlled by medicine.  Chest, back, or muscle pain.  People around you feel you are not acting correctly or are confused.  Shortness of breath or difficulty breathing.  Dizziness and fainting.  You get a rash or develop hives.  You have a decrease in urine output.  Your urine turns a dark color or changes to pink, red, or brown. Any of the following symptoms occur over the next 10 days:  You have a temperature by mouth above 102 F (38.9 C), not controlled by medicine.  Shortness of breath.  Weakness after normal activity.  The white part of the eye turns yellow (jaundice).  You have a decrease in the amount of urine or are urinating less often.  Your urine turns a dark color or changes to pink, red, or brown. Document Released: 04/02/2000 Document Revised: 06/28/2011 Document Reviewed: 11/20/2007 [facility] Patient Information 2014 [place], [place].  _______________________________________________________________________

## 2016-05-12 ENCOUNTER — Other Ambulatory Visit (INDEPENDENT_AMBULATORY_CARE_PROVIDER_SITE_OTHER): Payer: Self-pay

## 2016-05-12 ENCOUNTER — Encounter (HOSPITAL_COMMUNITY): Payer: Self-pay

## 2016-05-12 ENCOUNTER — Other Ambulatory Visit (INDEPENDENT_AMBULATORY_CARE_PROVIDER_SITE_OTHER): Payer: Self-pay | Admitting: Orthopaedic Surgery

## 2016-05-12 ENCOUNTER — Encounter (HOSPITAL_COMMUNITY)
Admission: RE | Admit: 2016-05-12 | Discharge: 2016-05-12 | Disposition: A | Discharge status: Home / Self Care | Payer: Medicare HMO | Source: Ambulatory Visit | Attending: Orthopaedic Surgery | Admitting: Orthopaedic Surgery

## 2016-05-12 DIAGNOSIS — K219 Gastro-esophageal reflux disease without esophagitis: Secondary | ICD-10-CM | POA: Diagnosis not present

## 2016-05-12 DIAGNOSIS — M1611 Unilateral primary osteoarthritis, right hip: Secondary | ICD-10-CM | POA: Diagnosis not present

## 2016-05-12 DIAGNOSIS — Z96653 Presence of artificial knee joint, bilateral: Secondary | ICD-10-CM | POA: Insufficient documentation

## 2016-05-12 DIAGNOSIS — Z8546 Personal history of malignant neoplasm of prostate: Secondary | ICD-10-CM | POA: Diagnosis not present

## 2016-05-12 DIAGNOSIS — M170 Bilateral primary osteoarthritis of knee: Secondary | ICD-10-CM | POA: Insufficient documentation

## 2016-05-12 DIAGNOSIS — Z01812 Encounter for preprocedural laboratory examination: Secondary | ICD-10-CM | POA: Diagnosis present

## 2016-05-12 DIAGNOSIS — R011 Cardiac murmur, unspecified: Secondary | ICD-10-CM | POA: Diagnosis not present

## 2016-05-12 DIAGNOSIS — I10 Essential (primary) hypertension: Secondary | ICD-10-CM | POA: Insufficient documentation

## 2016-05-12 HISTORY — DX: Personal history of other venous thrombosis and embolism: Z86.718

## 2016-05-12 HISTORY — DX: Essential (primary) hypertension: I10

## 2016-05-12 HISTORY — DX: Prediabetes: R73.03

## 2016-05-12 HISTORY — DX: Malignant (primary) neoplasm, unspecified: C80.1

## 2016-05-12 LAB — BASIC METABOLIC PANEL
ANION GAP: 8 (ref 5–15)
BUN: 17 mg/dL (ref 6–20)
CO2: 27 mmol/L (ref 22–32)
CREATININE: 0.83 mg/dL (ref 0.61–1.24)
Calcium: 8.6 mg/dL — ABNORMAL LOW (ref 8.9–10.3)
Chloride: 102 mmol/L (ref 101–111)
GFR calc Af Amer: >60 mL/min (ref >60)
GFR calc non Af Amer: >60 mL/min (ref >60)
GLUCOSE: 78 mg/dL (ref 65–99)
Potassium: 4.1 mmol/L (ref 3.5–5.1)
SODIUM: 137 mmol/L (ref 135–145)

## 2016-05-12 LAB — CBC
HEMATOCRIT: 40.4 % (ref 39.0–52.0)
Hemoglobin: 13.2 g/dL (ref 13.0–17.0)
MCH: 26.8 pg (ref 26.0–34.0)
MCHC: 32.7 g/dL (ref 30.0–36.0)
MCV: 82.1 fL (ref 78.0–100.0)
PLATELETS: 285 10*3/uL (ref 150–400)
RBC: 4.92 MIL/uL (ref 4.22–5.81)
RDW: 15.7 % — AB (ref 11.5–15.5)
WBC: 4 10*3/uL (ref 4.0–10.5)

## 2016-05-12 LAB — SURGICAL PCR SCREEN
MRSA, PCR: NEGATIVE
STAPHYLOCOCCUS AUREUS: NEGATIVE

## 2016-05-12 NOTE — Progress Notes (Signed)
EKG done 04/21/16 on chart 04/21/16 LOV Dr. Rex Kras on chart 04/03/16 ov on chart

## 2016-05-13 LAB — HEMOGLOBIN A1C
Hgb A1c MFr Bld: 5.7 % — ABNORMAL HIGH (ref 4.8–5.6)
MEAN PLASMA GLUCOSE: 117 mg/dL

## 2016-05-18 ENCOUNTER — Encounter: Payer: Self-pay | Admitting: Nurse Practitioner

## 2016-05-18 ENCOUNTER — Encounter: Payer: Self-pay | Admitting: Cardiovascular Disease

## 2016-05-18 ENCOUNTER — Ambulatory Visit (INDEPENDENT_AMBULATORY_CARE_PROVIDER_SITE_OTHER): Payer: Medicare HMO | Admitting: Cardiovascular Disease

## 2016-05-18 VITALS — BP 135/84 | HR 60 | Ht 72.0 in | Wt 195.8 lb

## 2016-05-18 DIAGNOSIS — I10 Essential (primary) hypertension: Secondary | ICD-10-CM

## 2016-05-18 DIAGNOSIS — G4730 Sleep apnea, unspecified: Secondary | ICD-10-CM | POA: Diagnosis not present

## 2016-05-18 DIAGNOSIS — I517 Cardiomegaly: Secondary | ICD-10-CM | POA: Diagnosis not present

## 2016-05-18 MED ORDER — POTASSIUM CHLORIDE ER 10 MEQ PO TBCR: 10.0000 meq | EXTENDED_RELEASE_TABLET | Freq: Every day | ORAL | 11 refills | Status: AC

## 2016-05-18 NOTE — Progress Notes (Signed)
Cardiology Office Note   Date:  05/18/2016   ID:  Miguel Nettles., DOB 03-08-1951, MRN [id number]  PCP:  Gennette Pac, MD  Cardiologist:   Mertie Moores, MD   Chief Complaint  Patient presents with  . New Patient (Initial Visit)    hypertension      History of Present Illness: Miguel Alworth. is a 66 y.o. male who presents for  HTN Seen with wife, Miguel Morris .   HTN for years.  Did not pay attention to his BP for years.  Only recently Started taking attention to his diet. He admits that he gets stressed out when he comes to the doctor because he knows he only got blood drawn. He's Stubborn - he says Gets stressed out when he comes to the doctor   BP at home has been normal for years but recently his BP readings have been elevated.   Used to exercise regularly - now has hip issues and cannot exercise as much  Able to to yard work - shoveled snow for hours several weeks ago .   Scheduled for hip replacement on Friday .   Tries to avoid salt.   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 Arthritis    oa and pain both knees  . Cancer [facility])    prostate  . GERD (gastroesophageal reflux disease)    TUMS EVERY NIGHT  . Heart murmur    STATES BORN WITH HEART MURMUR BUT NO LONGER HAS THE MURMUR  . Hx of blood clots    Bli LE  . Hypertension   . Pre-diabetes   . Prostate enlargement    HX OF ELEVATED PSA - DR. TANNENBAUM IS PT'[S UROLOGIST    Past Surgical History:  Procedure Laterality Date  . BROKEN CLAVICLE 1988    . TONSILLECTOMY     AS A CHILD  . TOTAL KNEE ARTHROPLASTY Bilateral 11/23/2013   Procedure: TOTAL KNEE BILATERAL;  Surgeon: Mcarthur Rossetti, MD;  Location: [facility];  Service: Orthopedics;  Laterality: Bilateral;     Current Outpatient Prescriptions  Medication Sig Dispense Refill  . amLODipine (NORVASC) 10 MG tablet Take 10 mg by mouth  daily.    . Calcium Carbonate Antacid (TUMS PO) Take 1-2 tablets by mouth at bedtime as needed (for hearburn/indigestion).     Marland Kitchen FLUoxetine (PROZAC) 40 MG capsule Take 40 mg by mouth daily.    . hydrochlorothiazide (MICROZIDE) 12.5 MG capsule Take 12.5 mg by mouth daily.    Marland Kitchen losartan (COZAAR) 50 MG tablet Take 100 mg by mouth daily.    . naproxen sodium (ANAPROX) 220 MG tablet Take 440 mg by mouth daily.    Marland Kitchen nystatin (NYSTATIN) powder Apply 1 g topically 3 (three) times daily as needed (irritated skin).    . Omega-3 Fatty Acids (FISH OIL) 500 MG CAPS Take 500-1,000 mg by mouth daily.    . pramipexole (MIRAPEX) 0.25 MG tablet Take 0.25 mg by mouth at bedtime.     . vitamin C (ASCORBIC ACID) 500 MG tablet Take 500 mg by mouth 3 (three) times daily.     No current facility-administered medications for this visit.     Allergies:   Patient has no known allergies.    Social History:  The patient  reports that he has quit smoking. He has never used smokeless  tobacco. He reports that he drinks alcohol. He reports that he does not use drugs.   Family History:  The patient's family history includes Stroke in his paternal grandfather. He was adopted.    ROS:  Please see the history of present illness.    Review of Systems: Constitutional:  denies fever, chills, diaphoresis, appetite change and fatigue.  HEENT: denies photophobia, eye pain, redness, hearing loss, ear pain, congestion, sore throat, rhinorrhea, sneezing, neck pain, neck stiffness and tinnitus.  Respiratory: denies SOB, DOE, cough, chest tightness, and wheezing.  Cardiovascular: denies chest pain, palpitations and leg swelling.  Gastrointestinal: denies nausea, vomiting, abdominal pain, diarrhea, constipation, blood in stool.  Genitourinary: denies dysuria, urgency, frequency, hematuria, flank pain and difficulty urinating.  Musculoskeletal: denies  myalgias, back pain, joint swelling, arthralgias and gait problem.   Skin: denies  pallor, rash and wound.  Neurological: denies dizziness, seizures, syncope, weakness, light-headedness, numbness and headaches.   Hematological: denies adenopathy, easy bruising, personal or family bleeding history.  Psychiatric/ Behavioral: denies suicidal ideation, mood changes, confusion, nervousness, sleep disturbance and agitation.       All other systems are reviewed and negative.    PHYSICAL EXAM: VS:  BP (!) 152/80   Pulse 60   Ht 6' (1.829 m)   Wt 195 lb 12.8 oz (88.8 kg)   BMI 26.56 kg/m  , BMI Body mass index is 26.56 kg/m. GEN: Well nourished, well developed, in no acute distress  HEENT: normal  Neck: no JVD, carotid bruits, or masses Cardiac: RRR; no murmurs, rubs, or gallops,no edema  Respiratory:  clear to auscultation bilaterally, normal work of breathing GI: soft, nontender, nondistended, + BS MS: no deformity or atrophy  Skin: warm and dry, no rash Neuro:  Strength and sensation are intact Psych: normal   EKG:  EKG is ordered today. The ekg ordered today demonstrates  NSR at 61.   1st degree AV block    Recent Labs: 05/12/2016: BUN 17; Creatinine, Ser 0.83; Hemoglobin 13.2; Platelets 285; Potassium 4.1; Sodium 137    Lipid Panel No results found for: CHOL, TRIG, HDL, CHOLHDL, VLDL, LDLCALC, LDLDIRECT    Wt Readings from Last 3 Encounters:  05/18/16 195 lb 12.8 oz (88.8 kg)  05/12/16 194 lb (88 kg)  11/28/13 183 lb 3.2 oz (83.1 kg)      Other studies Reviewed: Additional studies/ records that were reviewed today include: . Review of the above records demonstrates:    ASSESSMENT AND PLAN:  1.  HTN:   Continue meds for now His ECG and his primary medical doctor's office suggested left ventricular hypertrophy. No evidence of LVH on our ECG .  Will get an echo to evaluate that further .  He's on HCTZ.  . We will add potassium chloride to his medical regimen. We'll check a basic medical profile in 3 weeks.    2. Possible sleeping apnea:      Snores,  Has apneac episodes.  Wants to wait and schedule sleep study later.   I discussed the fact that if he has sleep apnea that this could be the cause of his hypertension and restless leg syndrome. He agrees to have a sleep study later in the year.  Current medicines are reviewed at length with the patient today.  The patient does not have concerns regarding medicines.  Labs/ tests ordered today include:  No orders of the defined types were placed in this encounter.  Disposition:   FU with me in 3 months  Mertie Moores, MD  05/18/2016 8:30 AM    [facility] [address], [place], [place]  [address] Phone: [phone]; Fax: [phone]

## 2016-05-18 NOTE — Patient Instructions (Signed)
Medication Instructions:  START Kdur (potassium supplement) 10 meq once daily   Labwork: Your physician recommends that you return for lab work in: 3 weeks for basic metabolic panel   Testing/Procedures: Your physician has requested that you have an echocardiogram. Echocardiography is a painless test that uses sound waves to create images of your heart. It provides your doctor with information about the size and shape of your heart and how well your heart's chambers and valves are working. This procedure takes approximately one hour. There are no restrictions for this procedure.   Follow-Up: Your physician recommends that you schedule a follow-up appointment in: 3 months with Dr. Acie Fredrickson   If you need a refill on your cardiac medications before your next appointment, please call your pharmacy.   Thank you for choosing [facility]! Christen Bame, RN [phone]

## 2016-05-18 NOTE — Progress Notes (Signed)
LOV- DR Nahser- 05/18/2016 in EPIC.  EKG done 05/18/16 in EPIC.

## 2016-05-21 ENCOUNTER — Inpatient Hospital Stay (HOSPITAL_COMMUNITY): Payer: Medicare Other | Admitting: Certified Registered Nurse Anesthetist

## 2016-05-21 ENCOUNTER — Inpatient Hospital Stay (HOSPITAL_COMMUNITY): Payer: Medicare Other

## 2016-05-21 ENCOUNTER — Encounter (HOSPITAL_COMMUNITY)
Admission: RE | Disposition: A | Discharge status: Home Health | Payer: Self-pay | Source: Ambulatory Visit | Attending: Orthopaedic Surgery

## 2016-05-21 ENCOUNTER — Encounter (HOSPITAL_COMMUNITY): Payer: Self-pay | Admitting: *Deleted

## 2016-05-21 ENCOUNTER — Inpatient Hospital Stay (HOSPITAL_COMMUNITY)
Admission: RE | Admit: 2016-05-21 | Discharge: 2016-05-22 | DRG: 470 | Disposition: A | Discharge status: Home Health | Payer: Medicare Other | Source: Ambulatory Visit | Attending: Orthopaedic Surgery | Admitting: Orthopaedic Surgery

## 2016-05-21 DIAGNOSIS — I10 Essential (primary) hypertension: Secondary | ICD-10-CM | POA: Diagnosis present

## 2016-05-21 DIAGNOSIS — M25551 Pain in right hip: Secondary | ICD-10-CM

## 2016-05-21 DIAGNOSIS — R7303 Prediabetes: Secondary | ICD-10-CM | POA: Diagnosis present

## 2016-05-21 DIAGNOSIS — M170 Bilateral primary osteoarthritis of knee: Secondary | ICD-10-CM | POA: Diagnosis not present

## 2016-05-21 DIAGNOSIS — N400 Enlarged prostate without lower urinary tract symptoms: Secondary | ICD-10-CM | POA: Diagnosis present

## 2016-05-21 DIAGNOSIS — K219 Gastro-esophageal reflux disease without esophagitis: Secondary | ICD-10-CM | POA: Diagnosis present

## 2016-05-21 DIAGNOSIS — Z96653 Presence of artificial knee joint, bilateral: Secondary | ICD-10-CM | POA: Diagnosis present

## 2016-05-21 DIAGNOSIS — M1611 Unilateral primary osteoarthritis, right hip: Principal | ICD-10-CM | POA: Diagnosis present

## 2016-05-21 DIAGNOSIS — Z96641 Presence of right artificial hip joint: Secondary | ICD-10-CM | POA: Diagnosis not present

## 2016-05-21 DIAGNOSIS — Z87891 Personal history of nicotine dependence: Secondary | ICD-10-CM

## 2016-05-21 DIAGNOSIS — I517 Cardiomegaly: Secondary | ICD-10-CM | POA: Diagnosis not present

## 2016-05-21 DIAGNOSIS — Z471 Aftercare following joint replacement surgery: Secondary | ICD-10-CM | POA: Diagnosis not present

## 2016-05-21 HISTORY — PX: TOTAL HIP ARTHROPLASTY: SHX124

## 2016-05-21 LAB — TYPE AND SCREEN
ABO/RH(D): O POS
Antibody Screen: NEGATIVE

## 2016-05-21 SURGERY — ARTHROPLASTY, HIP, TOTAL, ANTERIOR APPROACH
Anesthesia: Monitor Anesthesia Care | Site: Hip | Laterality: Right

## 2016-05-21 MED ORDER — METOCLOPRAMIDE HCL 5 MG/ML IJ SOLN: 5.0000 mg | Freq: Three times a day (TID) | INTRAMUSCULAR | Status: DC | PRN

## 2016-05-21 MED ORDER — TRANEXAMIC ACID 1000 MG/10ML IV SOLN
1000.0000 mg | INTRAVENOUS | Status: DC
  Filled 2016-05-21: qty 10

## 2016-05-21 MED ORDER — FENTANYL CITRATE (PF) 100 MCG/2ML IJ SOLN
INTRAMUSCULAR | Status: AC
  Filled 2016-05-21: qty 2

## 2016-05-21 MED ORDER — LOSARTAN POTASSIUM 50 MG PO TABS
100.0000 mg | ORAL_TABLET | Freq: Every day | ORAL | Status: DC
  Administered 2016-05-21: 100 mg via ORAL
  Filled 2016-05-21: qty 2

## 2016-05-21 MED ORDER — DEXAMETHASONE SODIUM PHOSPHATE 10 MG/ML IJ SOLN
INTRAMUSCULAR | Status: DC | PRN
  Administered 2016-05-21: 10 mg via INTRAVENOUS

## 2016-05-21 MED ORDER — OXYCODONE HCL 5 MG PO TABS
5.0000 mg | ORAL_TABLET | ORAL | Status: DC | PRN
  Administered 2016-05-21 (×2): 5 mg via ORAL
  Filled 2016-05-21 (×2): qty 1

## 2016-05-21 MED ORDER — HYDROCHLOROTHIAZIDE 12.5 MG PO CAPS
12.5000 mg | ORAL_CAPSULE | Freq: Every day | ORAL | Status: DC
  Administered 2016-05-21: 12.5 mg via ORAL
  Filled 2016-05-21: qty 1

## 2016-05-21 MED ORDER — ACETAMINOPHEN 650 MG RE SUPP: 650.0000 mg | Freq: Four times a day (QID) | RECTAL | Status: DC | PRN

## 2016-05-21 MED ORDER — MIDAZOLAM HCL 5 MG/5ML IJ SOLN
INTRAMUSCULAR | Status: DC | PRN
  Administered 2016-05-21: 2 mg via INTRAVENOUS

## 2016-05-21 MED ORDER — BUPIVACAINE HCL (PF) 0.5 % IJ SOLN
INTRAMUSCULAR | Status: AC
Start: 2016-05-21 — End: 2016-05-21
  Filled 2016-05-21: qty 30

## 2016-05-21 MED ORDER — HYDROMORPHONE HCL 1 MG/ML IJ SOLN: 1.0000 mg | INTRAMUSCULAR | Status: DC | PRN

## 2016-05-21 MED ORDER — PROPOFOL 10 MG/ML IV BOLUS
INTRAVENOUS | Status: DC | PRN
  Administered 2016-05-21 (×5): 10 mg via INTRAVENOUS

## 2016-05-21 MED ORDER — DOCUSATE SODIUM 100 MG PO CAPS
100.0000 mg | ORAL_CAPSULE | Freq: Two times a day (BID) | ORAL | Status: DC
  Administered 2016-05-21 – 2016-05-22 (×2): 100 mg via ORAL
  Filled 2016-05-21 (×2): qty 1

## 2016-05-21 MED ORDER — METHOCARBAMOL 500 MG PO TABS
500.0000 mg | ORAL_TABLET | Freq: Four times a day (QID) | ORAL | Status: DC | PRN
  Administered 2016-05-22 (×2): 500 mg via ORAL
  Filled 2016-05-21 (×2): qty 1

## 2016-05-21 MED ORDER — PROPOFOL 10 MG/ML IV BOLUS
INTRAVENOUS | Status: AC
  Filled 2016-05-21: qty 60

## 2016-05-21 MED ORDER — AMLODIPINE BESYLATE 10 MG PO TABS
10.0000 mg | ORAL_TABLET | Freq: Every day | ORAL | Status: DC
  Administered 2016-05-22: 10 mg via ORAL
  Filled 2016-05-21: qty 1

## 2016-05-21 MED ORDER — ALUM & MAG HYDROXIDE-SIMETH 200-200-20 MG/5ML PO SUSP: 30.0000 mL | ORAL | Status: DC | PRN

## 2016-05-21 MED ORDER — POTASSIUM CHLORIDE ER 10 MEQ PO TBCR
10.0000 meq | EXTENDED_RELEASE_TABLET | Freq: Every day | ORAL | Status: DC
  Administered 2016-05-21: 10 meq via ORAL
  Filled 2016-05-21 (×3): qty 1

## 2016-05-21 MED ORDER — KETOROLAC TROMETHAMINE 15 MG/ML IJ SOLN
7.5000 mg | Freq: Four times a day (QID) | INTRAMUSCULAR | Status: AC
  Administered 2016-05-21 – 2016-05-22 (×3): 7.5 mg via INTRAVENOUS
  Filled 2016-05-21 (×3): qty 1

## 2016-05-21 MED ORDER — VITAMIN C 500 MG PO TABS
500.0000 mg | ORAL_TABLET | Freq: Three times a day (TID) | ORAL | Status: DC
  Administered 2016-05-21 – 2016-05-22 (×3): 500 mg via ORAL
  Filled 2016-05-21 (×3): qty 1

## 2016-05-21 MED ORDER — CEFAZOLIN SODIUM-DEXTROSE 2-4 GM/100ML-% IV SOLN
INTRAVENOUS | Status: AC
  Filled 2016-05-21: qty 100

## 2016-05-21 MED ORDER — DIPHENHYDRAMINE HCL 12.5 MG/5ML PO ELIX: 12.5000 mg | ORAL_SOLUTION | ORAL | Status: DC | PRN

## 2016-05-21 MED ORDER — CEFAZOLIN IN D5W 1 GM/50ML IV SOLN
1.0000 g | Freq: Four times a day (QID) | INTRAVENOUS | Status: AC
  Administered 2016-05-21 (×2): 1 g via INTRAVENOUS
  Filled 2016-05-21 (×2): qty 50

## 2016-05-21 MED ORDER — MIDAZOLAM HCL 2 MG/2ML IJ SOLN
INTRAMUSCULAR | Status: AC
  Filled 2016-05-21: qty 2

## 2016-05-21 MED ORDER — ONDANSETRON HCL 4 MG PO TABS: 4.0000 mg | ORAL_TABLET | Freq: Four times a day (QID) | ORAL | Status: DC | PRN

## 2016-05-21 MED ORDER — PROPOFOL 500 MG/50ML IV EMUL
INTRAVENOUS | Status: DC | PRN
  Administered 2016-05-21: 50 ug/kg/min via INTRAVENOUS

## 2016-05-21 MED ORDER — ACETAMINOPHEN 325 MG PO TABS: 650.0000 mg | ORAL_TABLET | Freq: Four times a day (QID) | ORAL | Status: DC | PRN

## 2016-05-21 MED ORDER — POLYETHYLENE GLYCOL 3350 17 G PO PACK: 17.0000 g | PACK | Freq: Every day | ORAL | Status: DC | PRN

## 2016-05-21 MED ORDER — SODIUM CHLORIDE 0.9 % IV SOLN
INTRAVENOUS | Status: DC
  Administered 2016-05-21 – 2016-05-22 (×2): via INTRAVENOUS

## 2016-05-21 MED ORDER — SODIUM CHLORIDE 0.9 % IR SOLN
Status: DC | PRN
  Administered 2016-05-21: 1000 mL

## 2016-05-21 MED ORDER — METOCLOPRAMIDE HCL 5 MG PO TABS: 5.0000 mg | ORAL_TABLET | Freq: Three times a day (TID) | ORAL | Status: DC | PRN

## 2016-05-21 MED ORDER — BUPIVACAINE HCL (PF) 0.5 % IJ SOLN
INTRAMUSCULAR | Status: DC | PRN
  Administered 2016-05-21: 3 mL

## 2016-05-21 MED ORDER — FENTANYL CITRATE (PF) 100 MCG/2ML IJ SOLN
INTRAMUSCULAR | Status: DC | PRN
  Administered 2016-05-21: 50 ug via INTRAVENOUS

## 2016-05-21 MED ORDER — PRAMIPEXOLE DIHYDROCHLORIDE 0.25 MG PO TABS
0.2500 mg | ORAL_TABLET | Freq: Every day | ORAL | Status: DC
  Administered 2016-05-21: 0.25 mg via ORAL
  Filled 2016-05-21: qty 1

## 2016-05-21 MED ORDER — MENTHOL 3 MG MT LOZG: 1.0000 | LOZENGE | OROMUCOSAL | Status: DC | PRN

## 2016-05-21 MED ORDER — ONDANSETRON HCL 4 MG/2ML IJ SOLN: 4.0000 mg | Freq: Once | INTRAMUSCULAR | Status: DC | PRN

## 2016-05-21 MED ORDER — METHOCARBAMOL 1000 MG/10ML IJ SOLN
500.0000 mg | Freq: Four times a day (QID) | INTRAVENOUS | Status: DC | PRN
  Filled 2016-05-21: qty 5

## 2016-05-21 MED ORDER — CEFAZOLIN SODIUM-DEXTROSE 2-4 GM/100ML-% IV SOLN
2.0000 g | INTRAVENOUS | Status: AC
  Administered 2016-05-21: 2 g via INTRAVENOUS

## 2016-05-21 MED ORDER — PHENOL 1.4 % MT LIQD
1.0000 | OROMUCOSAL | Status: DC | PRN
  Filled 2016-05-21: qty 177

## 2016-05-21 MED ORDER — ASPIRIN 81 MG PO CHEW
81.0000 mg | CHEWABLE_TABLET | Freq: Two times a day (BID) | ORAL | Status: DC
  Administered 2016-05-21 – 2016-05-22 (×2): 81 mg via ORAL
  Filled 2016-05-21 (×2): qty 1

## 2016-05-21 MED ORDER — ZOLPIDEM TARTRATE 5 MG PO TABS: 5.0000 mg | ORAL_TABLET | Freq: Every evening | ORAL | Status: DC | PRN

## 2016-05-21 MED ORDER — ONDANSETRON HCL 4 MG/2ML IJ SOLN
INTRAMUSCULAR | Status: AC
  Filled 2016-05-21: qty 2

## 2016-05-21 MED ORDER — LACTATED RINGERS IV SOLN
INTRAVENOUS | Status: DC
  Administered 2016-05-21 (×2): via INTRAVENOUS

## 2016-05-21 MED ORDER — ONDANSETRON HCL 4 MG/2ML IJ SOLN: 4.0000 mg | Freq: Four times a day (QID) | INTRAMUSCULAR | Status: DC | PRN

## 2016-05-21 MED ORDER — ONDANSETRON HCL 4 MG/2ML IJ SOLN
INTRAMUSCULAR | Status: DC | PRN
Start: 2016-05-21 — End: 2016-05-21
  Administered 2016-05-21: 4 mg via INTRAVENOUS

## 2016-05-21 MED ORDER — DEXAMETHASONE SODIUM PHOSPHATE 10 MG/ML IJ SOLN
INTRAMUSCULAR | Status: AC
  Filled 2016-05-21: qty 1

## 2016-05-21 MED ORDER — HYDROMORPHONE HCL 1 MG/ML IJ SOLN: 0.2500 mg | INTRAMUSCULAR | Status: DC | PRN

## 2016-05-21 MED ORDER — FLUOXETINE HCL 20 MG PO CAPS
40.0000 mg | ORAL_CAPSULE | Freq: Every day | ORAL | Status: DC
  Administered 2016-05-22: 40 mg via ORAL
  Filled 2016-05-21: qty 2

## 2016-05-21 MED ORDER — MEPERIDINE HCL 50 MG/ML IJ SOLN: 6.2500 mg | INTRAMUSCULAR | Status: DC | PRN

## 2016-05-21 MED ORDER — STERILE WATER FOR IRRIGATION IR SOLN
Status: DC | PRN
  Administered 2016-05-21: 2000 mL

## 2016-05-21 SURGICAL SUPPLY — 39 items
APL SKNCLS STERI-STRIP NONHPOA (GAUZE/BANDAGES/DRESSINGS) ×1
BAG SPEC THK2 15X12 ZIP CLS (MISCELLANEOUS)
BAG ZIPLOCK 12X15 (MISCELLANEOUS) IMPLANT
BENZOIN TINCTURE PRP APPL 2/3 (GAUZE/BANDAGES/DRESSINGS) ×2 IMPLANT
BLADE SAW SGTL 18X1.27X75 (BLADE) ×2 IMPLANT
BLADE SAW SGTL 18X1.27X75MM (BLADE) ×1
CAPT HIP TOTAL 2 ×2 IMPLANT
CELLS DAT CNTRL 66122 CELL SVR (MISCELLANEOUS) ×1 IMPLANT
CLOSURE WOUND 1/2 X4 (GAUZE/BANDAGES/DRESSINGS) ×1
CLOTH BEACON ORANGE TIMEOUT ST (SAFETY) ×3 IMPLANT
COVER PERINEAL POST (MISCELLANEOUS) ×3 IMPLANT
DRAPE STERI IOBAN 125X83 (DRAPES) ×3 IMPLANT
DRAPE U-SHAPE 47X51 STRL (DRAPES) ×6 IMPLANT
DRSG AQUACEL AG ADV 3.5X10 (GAUZE/BANDAGES/DRESSINGS) ×3 IMPLANT
DURAPREP 26ML APPLICATOR (WOUND CARE) ×3 IMPLANT
ELECT REM PT RETURN 9FT ADLT (ELECTROSURGICAL) ×3
ELECTRODE REM PT RTRN 9FT ADLT (ELECTROSURGICAL) ×1 IMPLANT
GAUZE XEROFORM 1X8 LF (GAUZE/BANDAGES/DRESSINGS) IMPLANT
GLOVE BIO SURGEON STRL SZ7.5 (GLOVE) ×3 IMPLANT
GLOVE BIOGEL PI IND STRL 8 (GLOVE) ×2 IMPLANT
GLOVE BIOGEL PI INDICATOR 8 (GLOVE) ×4
GLOVE ECLIPSE 8.0 STRL XLNG CF (GLOVE) ×3 IMPLANT
GOWN STRL REUS W/TWL XL LVL3 (GOWN DISPOSABLE) ×6 IMPLANT
HANDPIECE INTERPULSE COAX TIP (DISPOSABLE) ×3
HOLDER FOLEY CATH W/STRAP (MISCELLANEOUS) ×3 IMPLANT
PACK ANTERIOR HIP CUSTOM (KITS) ×3 IMPLANT
RETRACTOR WND ALEXIS 18 MED (MISCELLANEOUS) ×1 IMPLANT
RTRCTR WOUND ALEXIS 18CM MED (MISCELLANEOUS) ×3
SET HNDPC FAN SPRY TIP SCT (DISPOSABLE) ×1 IMPLANT
STAPLER VISISTAT 35W (STAPLE) IMPLANT
STRIP CLOSURE SKIN 1/2X4 (GAUZE/BANDAGES/DRESSINGS) ×1 IMPLANT
SUT ETHIBOND NAB CT1 #1 30IN (SUTURE) ×3 IMPLANT
SUT MNCRL AB 4-0 PS2 18 (SUTURE) ×2 IMPLANT
SUT VIC AB 0 CT1 36 (SUTURE) ×3 IMPLANT
SUT VIC AB 1 CT1 36 (SUTURE) ×3 IMPLANT
SUT VIC AB 2-0 CT1 27 (SUTURE) ×6
SUT VIC AB 2-0 CT1 TAPERPNT 27 (SUTURE) ×2 IMPLANT
TRAY FOLEY W/METER SILVER 16FR (SET/KITS/TRAYS/PACK) ×3 IMPLANT
YANKAUER SUCT BULB TIP 10FT TU (MISCELLANEOUS) ×3 IMPLANT

## 2016-05-21 NOTE — Anesthesia Preprocedure Evaluation (Signed)
Anesthesia Evaluation  Patient identified by MRN, date of birth, ID band Patient awake    Reviewed: Allergy & Precautions, NPO status , Patient's Chart, lab work & pertinent test results  Airway Mallampati: I  TM Distance: >3 FB Neck ROM: Full    Dental   Pulmonary former smoker,    Pulmonary exam normal        Cardiovascular hypertension, Pt. on medications Normal cardiovascular exam     Neuro/Psych    GI/Hepatic GERD  Medicated and Controlled,  Endo/Other    Renal/GU      Musculoskeletal   Abdominal   Peds  Hematology   Anesthesia Other Findings   Reproductive/Obstetrics                             Anesthesia Physical Anesthesia Plan  ASA: II  Anesthesia Plan: Spinal and MAC   Post-op Pain Management:    Induction: Intravenous  Airway Management Planned: Simple Face Mask  Additional Equipment:   Intra-op Plan:   Post-operative Plan:   Informed Consent: I have reviewed the patients History and Physical, chart, labs and discussed the procedure including the risks, benefits and alternatives for the proposed anesthesia with the patient or authorized representative who has indicated his/her understanding and acceptance.     Plan Discussed with: CRNA and Surgeon  Anesthesia Plan Comments:         Anesthesia Quick Evaluation

## 2016-05-21 NOTE — Brief Op Note (Signed)
05/21/2016  10:47 AM  PATIENT:  Miguel Morris.  66 y.o. male  PRE-OPERATIVE DIAGNOSIS:  osteoarthritis right hip  POST-OPERATIVE DIAGNOSIS:  osteoarthritis right hip  PROCEDURE:  Procedure(s): RIGHT TOTAL HIP ARTHROPLASTY ANTERIOR APPROACH (Right)  SURGEON:  Surgeon(s) and Role:    * Mcarthur Rossetti, MD - Primary  PHYSICIAN ASSISTANT: Benita Stabile, PA-C  ANESTHESIA:   spinal  EBL:  Total I/O In: 1000 [I.V.:1000] Out: 500 [Urine:300; Blood:200]  COUNTS:  YES  DICTATION: .Other Dictation: Dictation Number [id number]  PLAN OF CARE: Admit to inpatient   PATIENT DISPOSITION:  PACU - hemodynamically stable.   Delay start of Pharmacological VTE agent (>24hrs) due to surgical blood loss or risk of bleeding: no

## 2016-05-21 NOTE — Evaluation (Signed)
Physical Therapy Evaluation Patient Details Name: Miguel Morris. MRN: [id number] DOB: [birth year] Today's Date: 05/21/2016   History of Present Illness  Pt s/p R THR and with hx of bil TKR  Clinical Impression  Pt s/p R THR presents with decreased R LE strength/ROM and post op pain limiting functional mobility.  Pt should progress to dc home with family assist and HHPT follow up.    Follow Up Recommendations Home health PT    Equipment Recommendations  None recommended by PT    Recommendations for Other Services OT consult     Precautions / Restrictions Precautions Precautions: Fall Restrictions Weight Bearing Restrictions: No Other Position/Activity Restrictions: WBAT      Mobility  Bed Mobility Overal bed mobility: Needs Assistance Bed Mobility: Supine to Sit     Supine to sit: Min assist     General bed mobility comments: cues for sequence and use of L LE to self assist  Transfers Overall transfer level: Needs assistance Equipment used: Rolling walker (2 wheeled) Transfers: Sit to/from Stand Sit to Stand: Min assist         General transfer comment: cues for LE management and use of UEs to self assist  Ambulation/Gait Ambulation/Gait assistance: Min assist Ambulation Distance (Feet): 111 Feet Assistive device: Rolling walker (2 wheeled) Gait Pattern/deviations: Step-to pattern;Decreased step length - right;Decreased step length - left;Shuffle;Trunk flexed Gait velocity: decr Gait velocity interpretation: Below normal speed for age/gender General Gait Details: cues for sequence, posture and position from [employer]            Wheelchair Mobility    Modified Rankin (Stroke Patients Only)       Balance                                             Pertinent Vitals/Pain Pain Assessment: 0-10 Pain Score: 3  Pain Location: R hip Pain Descriptors / Indicators: Aching;Sore Pain Intervention(s): Limited activity within  patient's tolerance;Monitored during session;Premedicated before session;Ice applied    Home Living Family/patient expects to be discharged to:: Private residence Living Arrangements: Spouse/significant other Available Help at Discharge: Family Type of Home: House Home Access: Stairs to enter Entrance Stairs-Rails: Right Entrance Stairs-Number of Steps: 4 Home Layout: Able to live on main level with bedroom/bathroom Home Equipment: Gilford Rile - 2 wheels      Prior Function Level of Independence: Independent               Hand Dominance        Extremity/Trunk Assessment   Upper Extremity Assessment Upper Extremity Assessment: Overall WFL for tasks assessed    Lower Extremity Assessment Lower Extremity Assessment: RLE deficits/detail    Cervical / Trunk Assessment Cervical / Trunk Assessment: Normal  Communication   Communication: No difficulties  Cognition Arousal/Alertness: Awake/alert Behavior During Therapy: WFL for tasks assessed/performed Overall Cognitive Status: Within Functional Limits for tasks assessed                      General Comments      Exercises Total Joint Exercises Ankle Circles/Pumps: AROM;Both;15 reps;Supine   Assessment/Plan    PT Assessment Patient needs continued PT services  PT Problem List Decreased strength;Decreased range of motion;Decreased activity tolerance;Decreased mobility;Decreased knowledge of use of DME;Pain          PT Treatment Interventions DME instruction;Gait training;Stair training;Functional  mobility training;Therapeutic activities;Therapeutic exercise;Patient/family education    PT Goals (Current goals can be found in the Care Plan section)  Acute Rehab PT Goals Patient Stated Goal: Regain ind PT Goal Formulation: With patient Time For Goal Achievement: 05/26/16 Potential to Achieve Goals: Good    Frequency 7X/week   Barriers to discharge        Co-evaluation               End of  Session Equipment Utilized During Treatment: Gait belt Activity Tolerance: Patient tolerated treatment well Patient left: in chair;with call bell/phone within reach;with chair alarm set;with nursing/sitter in room;with family/visitor present Nurse Communication: Mobility status         Time: [id number] PT Time Calculation (min) (ACUTE ONLY): 25 min   Charges:   PT Evaluation $PT Eval Low Complexity: 1 Procedure PT Treatments $Gait Training: 8-22 mins   PT G Codes:        Enedelia Martorelli 06/07/16, 5:29 PM

## 2016-05-21 NOTE — Anesthesia Postprocedure Evaluation (Signed)
Anesthesia Post Note  Patient: Miguel Morris.  Procedure(s) Performed: Procedure(s) (LRB): RIGHT TOTAL HIP ARTHROPLASTY ANTERIOR APPROACH (Right)  Patient location during evaluation: PACU Anesthesia Type: MAC Level of consciousness: oriented and awake and alert Pain management: pain level controlled Vital Signs Assessment: post-procedure vital signs reviewed and stable Respiratory status: spontaneous breathing, respiratory function stable and patient connected to nasal cannula oxygen Cardiovascular status: blood pressure returned to baseline and stable Postop Assessment: no headache and no backache Anesthetic complications: no       Last Vitals:  Vitals:   05/21/16 1436 05/21/16 1540  BP: (!) 142/80 136/75  Pulse: 73 75  Resp: 16 16  Temp: 36.5 C 37.1 C    Last Pain:  Vitals:   05/21/16 1540  TempSrc: Axillary  PainSc:                  Spenser Harren DAVID

## 2016-05-21 NOTE — H&P (Signed)
TOTAL HIP ADMISSION H&P  Patient is admitted for right total hip arthroplasty.  Subjective:  Chief Complaint: right hip pain  HPI: Miguel Morris., 66 y.o. male, has a history of pain and functional disability in the right hip(s) due to arthritis and patient has failed non-surgical conservative treatments for greater than 12 weeks to include NSAID's and/or analgesics and activity modification.  Onset of symptoms was gradual starting 2 years ago with gradually worsening course since that time.The patient noted no past surgery on the right hip(s).  Patient currently rates pain in the right hip at 10 out of 10 with activity. Patient has night pain, worsening of pain with activity and weight bearing, pain that interfers with activities of daily living, pain with passive range of motion and crepitus. Patient has evidence of subchondral cysts, subchondral sclerosis, periarticular osteophytes and joint space narrowing by imaging studies. This condition presents safety issues increasing the risk of falls.  There is no current active infection.  Patient Active Problem List   Diagnosis Date Noted  . Unilateral primary osteoarthritis, right hip 05/21/2016  . Left ventricular hypertrophy 05/18/2016  . Essential hypertension 05/18/2016  . Status post bilateral knee replacements 11/27/2013  . Degenerative arthritis of knee, bilateral 11/23/2013  . Status post total bilateral knee replacement 11/23/2013   Past Medical History:  Diagnosis Date  . Arthritis    oa and pain both knees  . Cancer [facility])    prostate  . GERD (gastroesophageal reflux disease)    TUMS EVERY NIGHT  . Heart murmur    STATES BORN WITH HEART MURMUR BUT NO LONGER HAS THE MURMUR  . Hx of blood clots    Bli LE  . Hypertension   . Pre-diabetes   . Prostate enlargement    HX OF ELEVATED PSA - DR. TANNENBAUM IS PT'[S UROLOGIST    Past Surgical History:  Procedure Laterality Date  . BROKEN CLAVICLE 1988    . TONSILLECTOMY     AS A CHILD  . TOTAL KNEE ARTHROPLASTY Bilateral 11/23/2013   Procedure: TOTAL KNEE BILATERAL;  Surgeon: Mcarthur Rossetti, MD;  Location: [facility] ORS;  Service: Orthopedics;  Laterality: Bilateral;    No prescriptions prior to admission.   No Known Allergies  Social History  Substance Use Topics  . Smoking status: Former Research scientist (life sciences)  . Smokeless tobacco: Never Used     Comment: quit 35 years ago  . Alcohol use Yes     Comment: former 2-4 drinks quit 3 weeks ago    Family History  Problem Relation Age of Onset  . Adopted: Yes  . Stroke Paternal Grandfather      Review of Systems  Musculoskeletal: Positive for joint pain.  All other systems reviewed and are negative.   Objective:  Physical Exam  Constitutional: He is oriented to person, place, and time. He appears well-developed and well-nourished.  HENT:  Head: Normocephalic and atraumatic.  Eyes: EOM are normal. Pupils are equal, round, and reactive to light.  Neck: Normal range of motion. Neck supple.  Cardiovascular: Normal rate and regular rhythm.   Respiratory: Effort normal and breath sounds normal.  GI: Soft. Bowel sounds are normal.  Musculoskeletal:       Right hip: He exhibits decreased range of motion, decreased strength, tenderness and bony tenderness.  Neurological: He is alert and oriented to person, place, and time.  Skin: Skin is warm and dry.  Psychiatric: He has a normal mood and affect.    Vital signs in  last 24 hours:    Labs:   Estimated body mass index is 26.56 kg/m as calculated from the following:   Height as of 05/18/16: 6' (1.829 m).   Weight as of 05/18/16: 195 lb 12.8 oz (88.8 kg).   Imaging Review Plain radiographs demonstrate severe degenerative joint disease of the right hip(s). The bone quality appears to be excellent for age and reported activity level.  Assessment/Plan:  End stage arthritis, right hip(s)  The patient history, physical examination, clinical judgement of the  provider and imaging studies are consistent with end stage degenerative joint disease of the right hip(s) and total hip arthroplasty is deemed medically necessary. The treatment options including medical management, injection therapy, arthroscopy and arthroplasty were discussed at length. The risks and benefits of total hip arthroplasty were presented and reviewed. The risks due to aseptic loosening, infection, stiffness, dislocation/subluxation,  thromboembolic complications and other imponderables were discussed.  The patient acknowledged the explanation, agreed to proceed with the plan and consent was signed. Patient is being admitted for inpatient treatment for surgery, pain control, PT, OT, prophylactic antibiotics, VTE prophylaxis, progressive ambulation and ADL's and discharge planning.The patient is planning to be discharged home with home health services

## 2016-05-21 NOTE — Anesthesia Procedure Notes (Signed)
Spinal  Start time: 05/21/2016 9:32 AM End time: 05/21/2016 9:37 AM Staffing Anesthesiologist: Lillia Abed Resident/CRNA: Darlys Gales R Performed: resident/CRNA  Preanesthetic Checklist Completed: patient identified, site marked, surgical consent, pre-op evaluation, timeout performed, IV checked, risks and benefits discussed and monitors and equipment checked Spinal Block Patient position: sitting Prep: DuraPrep Patient monitoring: heart rate, continuous pulse ox and blood pressure Approach: midline Location: L3-4 Injection technique: single-shot Needle Needle type: Spinocan  Needle gauge: 22 G Needle length: 9 cm Needle insertion depth: 7 cm Assessment Sensory level: T6 Additional Notes Lot [id number] exp 2017-04-18

## 2016-05-21 NOTE — Progress Notes (Signed)
Chaplain stopped by to visit with this jovial family who introduced the game Passing the Pigs.  Chaplain was immediately greeted and welcomed by all who were present in the room.    Patient said he is doing much better and will be going home tomorrow.  Wife states jokingly that he will not be pushing any cars anytime soon.  Soon the patient and his wife shared the story with me about when he had bilateral knee surgery and was pushing a car approximately three weeks later.    Patient and his neighbors continued to tell jokes causing [place] all to laugh.  There were no spiritual needs identified at this time.  Chaplain made patient aware of [facility] and then physical therapy was there to take him for his therapy.  As the Chaplain left the room, the patient and his wife and friends continued in the same wonderful manner with the therapist.  They were appreciative of all services they had received.   Berneice Heinrich. Chaplain Resident Pager:  [phone]   05/21/16 1600  Clinical Encounter Type  Visited With Patient and family together  Visit Type Initial

## 2016-05-21 NOTE — Transfer of Care (Signed)
Immediate Anesthesia Transfer of Care Note  Patient: Miguel Morris.  Procedure(s) Performed: Procedure(s): RIGHT TOTAL HIP ARTHROPLASTY ANTERIOR APPROACH (Right)  Patient Location: PACU  Anesthesia Type:Spinal  Level of Consciousness: awake, alert  and oriented  Airway & Oxygen Therapy: Patient Spontanous Breathing and Patient connected to face mask oxygen  Post-op Assessment: Report given to RN and Post -op Vital signs reviewed and stable  Post vital signs: Reviewed and stable  Last Vitals:  Vitals:   05/21/16 0805  BP: (!) 153/85  Pulse: 70  Resp: 16  Temp: 36.4 C    Last Pain:  Vitals:   05/21/16 0805  TempSrc: Oral      Patients Stated Pain Goal: 4 ([id number])  Complications: No apparent anesthesia complications

## 2016-05-21 NOTE — Progress Notes (Signed)
CSW received consult for SNF placement - reviewed H&P indicating that patient plans to return home with home health.   No further CSW needs identified - CSW signing off.   Raynaldo Opitz, [facility] Clinical Social Worker cell #: [phone]

## 2016-05-22 LAB — BASIC METABOLIC PANEL
Anion gap: 6 (ref 5–15)
BUN: 19 mg/dL (ref 6–20)
CALCIUM: 8 mg/dL — AB (ref 8.9–10.3)
CO2: 28 mmol/L (ref 22–32)
CREATININE: 0.8 mg/dL (ref 0.61–1.24)
Chloride: 103 mmol/L (ref 101–111)
GFR calc Af Amer: >60 mL/min (ref >60)
GLUCOSE: 116 mg/dL — AB (ref 65–99)
Potassium: 3.5 mmol/L (ref 3.5–5.1)
Sodium: 137 mmol/L (ref 135–145)

## 2016-05-22 LAB — CBC
HEMATOCRIT: 28.6 % — AB (ref 39.0–52.0)
Hemoglobin: 9.6 g/dL — ABNORMAL LOW (ref 13.0–17.0)
MCH: 27.4 pg (ref 26.0–34.0)
MCHC: 33.6 g/dL (ref 30.0–36.0)
MCV: 81.5 fL (ref 78.0–100.0)
Platelets: 215 10*3/uL (ref 150–400)
RBC: 3.51 MIL/uL — ABNORMAL LOW (ref 4.22–5.81)
RDW: 15.4 % (ref 11.5–15.5)
WBC: 7.1 10*3/uL (ref 4.0–10.5)

## 2016-05-22 MED ORDER — POTASSIUM CHLORIDE CRYS ER 10 MEQ PO TBCR
10.0000 meq | EXTENDED_RELEASE_TABLET | Freq: Every day | ORAL | Status: DC
  Administered 2016-05-22: 09:00:00 10 meq via ORAL
  Filled 2016-05-22: qty 1

## 2016-05-22 MED ORDER — ASPIRIN 81 MG PO CHEW: 81.0000 mg | CHEWABLE_TABLET | Freq: Two times a day (BID) | ORAL | 0 refills | Status: DC

## 2016-05-22 MED ORDER — CYCLOBENZAPRINE HCL 10 MG PO TABS: 10.0000 mg | ORAL_TABLET | Freq: Three times a day (TID) | ORAL | 0 refills | Status: DC | PRN

## 2016-05-22 MED ORDER — OXYCODONE-ACETAMINOPHEN 5-325 MG PO TABS: 1.0000 | ORAL_TABLET | ORAL | 0 refills | Status: DC | PRN

## 2016-05-22 NOTE — Progress Notes (Signed)
Pt to d/c home. Ryan and denies need of DME. AVS reviewed and "My Chart" discussed with pt. Pt capable of verbalizing medications, signs and symptoms of infection, and follow-up appointments. Remains hemodynamically stable. No signs and symptoms of distress. Educated pt to return to ER in the case of SOB, dizziness, or chest pain.

## 2016-05-22 NOTE — Progress Notes (Signed)
Physical Therapy Treatment Patient Details Name: Miguel Morris. MRN: [id number] DOB: 1950/12/20 Today's Date: 2016-06-12    History of Present Illness Pt s/p R THR and with hx of bil TKR    PT Comments    Pt progressing well with mobility and eager for return home.  Reviewed stairs, car transfers and home therex with pt and spouse.  Follow Up Recommendations  Home health PT     Equipment Recommendations  None recommended by PT    Recommendations for Other Services OT consult     Precautions / Restrictions Precautions Precautions: Fall Restrictions Weight Bearing Restrictions: No Other Position/Activity Restrictions: WBAT    Mobility  Bed Mobility                  Transfers Overall transfer level: Needs assistance Equipment used: Rolling walker (2 wheeled) Transfers: Sit to/from Stand Sit to Stand: Supervision            Ambulation/Gait Ambulation/Gait assistance: Min guard;Supervision Ambulation Distance (Feet): 250 Feet Assistive device: Rolling walker (2 wheeled) Gait Pattern/deviations: Decreased step length - right;Decreased step length - left;Shuffle;Trunk flexed;Step-to pattern;Step-through pattern Gait velocity: decr Gait velocity interpretation: Below normal speed for age/gender General Gait Details: min cues for posture and position from RW   Stairs Stairs: Yes   Stair Management: One rail Left;Forwards;Step to pattern;With cane Number of Stairs: 8 General stair comments: cues for sequence and foot/cane placement  Wheelchair Mobility    Modified Rankin (Stroke Patients Only)       Balance                                    Cognition Arousal/Alertness: Awake/alert Behavior During Therapy: WFL for tasks assessed/performed Overall Cognitive Status: Within Functional Limits for tasks assessed                      Exercises      General Comments        Pertinent Vitals/Pain Pain Assessment:  0-10 Pain Score: 3  Pain Location: R hip Pain Descriptors / Indicators: Aching;Sore;Tightness Pain Intervention(s): Limited activity within patient's tolerance;Monitored during session;Premedicated before session;Ice applied    Home Living                      Prior Function            PT Goals (current goals can now be found in the care plan section) Acute Rehab PT Goals Patient Stated Goal: Regain ind PT Goal Formulation: With patient Time For Goal Achievement: 05/26/16 Potential to Achieve Goals: Good Progress towards PT goals: Progressing toward goals    Frequency    7X/week      PT Plan Current plan remains appropriate    Co-evaluation             End of Session Equipment Utilized During Treatment: Gait belt Activity Tolerance: Patient tolerated treatment well Patient left: in chair;with call bell/phone within reach;with chair alarm set;with nursing/sitter in room;with family/visitor present     Time: [id number] PT Time Calculation (min) (ACUTE ONLY): 35 min  Charges:  $Gait Training: 8-22 mins $Therapeutic Activity: 8-22 mins                    G Codes:      Miguel Morris 2016/06/12, 4:15 PM

## 2016-05-22 NOTE — Care Management Note (Signed)
Case Management Note  Patient Details  Name: Miguel Morris. MRN: [id number] Date of Birth: December 21, 1950  Subjective/Objective:   S/p THR                 Action/Plan: Discharge Planning: NCM spoke to pt and wife at home to assist with his care. Has cane at home and will borrow RW from a friend. Pt states he does not have any stairs inside the home. He is declining HH at this time. Has exercises given by PT to do at home.   PCP Hulan Fess MD  Expected Discharge Date:  05/22/2016             Expected Discharge Plan:  [place]  In-House Referral:  NA  Discharge planning Services  CM Consult  Post Acute Care Choice:  NA Choice offered to:  NA  DME Arranged:  N/A DME Agency:  NA  HH Arranged:  Patient Refused [place] Agency:  NA  Status of Service:  Completed, signed off  If discussed at [place] of Stay Meetings, dates discussed:    Additional Comments:  Erenest Rasher, RN 05/22/2016, 10:43 AM

## 2016-05-22 NOTE — Op Note (Signed)
NAME:  Miguel Morris, Miguel Morris.          ACCOUNT NO.:  [id number]  MEDICAL RECORD NO.:  [id number]  LOCATION:                               FACILITY:  [facility]  PHYSICIAN:  Lind Guest. Ninfa Linden, M.D.DATE OF BIRTH:  [birth year]  DATE OF PROCEDURE:  05/21/2016 DATE OF DISCHARGE:                              OPERATIVE REPORT   PREOPERATIVE DIAGNOSIS:  Severe end-stage arthritis and degenerative joint disease, right hip.  POSTOPERATIVE DIAGNOSIS:  Severe end-stage arthritis and degenerative joint disease, right hip.  PROCEDURE:  Right total hip arthroplasty through direct anterior approach.  IMPLANTS:  [employer] acetabular component size 56, size 36+ 4 polyethylene liner, size 14 Corail femoral component with standard offset, size 36+ 8.5 ceramic hip ball.  SURGEON:  Lind Guest. Ninfa Linden, MD  ASSISTANT:  Erskine Emery, PA-C  ANESTHESIA:  Spinal.  ANTIBIOTICS:  2 g of IV Ancef.  BLOOD LOSS:  200 mL.  COMPLICATIONS:  None.  INDICATIONS:  Mr. Hammer is a 66 year old gentleman, well known to me. He has debilitating arthritis involving his right hip, confirmed with clinical exam and x-rays.  His pain is daily and has detrimentally affected his activities of daily living, his mobility and his quality of life.  His x-ray showed complete loss of the joint space of his right hip.  There was severe periarticular osteophytes and cystic changes as well as sclerotic changes.  At this point, he does wish to proceed with a total hip arthroplasty.  He understands the risk of acute blood loss anemia, nerve and vessel injury, fracture, infection, dislocation, and DVT.  He understands our goals are decreased pain, improved mobility, and overall improved quality of life.  PROCEDURE DESCRIPTION:  After informed consent was obtained, appropriate right hip was marked.  He was brought to the operating room, where spinal anesthesia was obtained while he was on a stretcher.  He was  then laid in a supine position on a stretcher.  A Foley catheter was placed and both feet had traction boots applied to them.  Next, he was placed supine on the Hana fracture table with the perineal post in place and both legs in inline skeletal traction devices, but no traction applied. His right operative hip was then prepped and draped with DuraPrep and sterile drapes.  A time-out was called and he was identified as correct patient, correct right hip.  I then made incision just inferior and posterior to the anterior superior iliac spine and carried obliquely down the leg.  I dissected down tensor fascia lata muscle.  The tensor fascia was then divided longitudinally to proceed with a direct anterior approach to the hip.  We identified and cauterized the circumflex vessels and then identified the hip capsule.  I opened up the hip capsule in an L-type format finding a very large joint effusion and significant periarticular osteophytes.  We placed Cobra retractor on the medial and lateral femoral neck and then made our femoral neck cut with the oscillating saw proximal to lesser trochanter and completed this on osteotome.  We placed a corkscrew guide in the femoral head and removed the femoral head in its entirety and found it to be devoid of cartilage. We then placed  a bent Hohmann over the medial acetabular rim and removed remnants of acetabular labrum.  We then began reaming under direct visualization from a size 42 reamer in stepwise increments up to a size 56.  With all reamers under direct visualization, the last reamer under direct fluoroscopy, so we could obtain our depth of reaming, our inclination, and anteversion.  Since we had medialized him, we went with a 36+ 4 neutral polyethylene liner.  Under direct fluoroscopy, we were able to place the acetabular component with what we felt was the correct inclination, depth and anteversion.  We then placed the real 36+ 4 polyethylene  liner for that size 56 acetabular component.  Attention was then turned to the femur with the leg externally rotated to 120 degrees, extended and abducted, we were able to place a Mueller retractor medially and Hohmann retractor behind the greater trochanter.  We released the lateral joint capsule and used a box cutting osteotome to enter the femoral canal and a rongeur to lateralize.  I then began broaching from a size 8 broach using the Corail broaching system up to a size 14.  With a size 14 in place, we trialed a standard offset femoral neck and a 36+ 1.5 hip ball, rolled leg back over and up with traction and rotation reducing the pelvis, and I felt that he needed to have at least more leg length due to medialization.  This was of the acetabular component.  We dislocated the hip and removed the trial components.  We were pleased with the fit of the standard offset femoral component size 14, so we placed that real Corail femoral component size 14 with standard offset without difficulty.  We then went with a real 36+ 8.5 ceramic hip ball, reduced this in the acetabulum and we were pleased with leg length, offset, and stability.  We then irrigated the soft tissue with normal saline solution using pulsatile lavage.  We closed the joint capsule with interrupted #1 Ethibond suture followed by running #1 Vicryl in tensor fascia, 0 Vicryl in the deep tissue, 2-0 Vicryl in the subcutaneous tissue, 4-0 Monocryl subcuticular stitch and Steri-Strips on the skin.  Aquacel dressing was then applied.  He was taken off the Hana table and taken to recovery room in stable condition. All final counts were correct.  There were no complications noted.  Of note, Erskine Emery, PA-C assisted in the entire case.  His assistance was crucial for facilitating all aspects of this case.     Lind Guest. Ninfa Linden, M.D.   ______________________________ Lind Guest. Ninfa Linden, M.D.    CYB/MEDQ  D:   05/21/2016  T:  05/22/2016  Job:  [id number]

## 2016-05-22 NOTE — Discharge Summary (Signed)
Patient ID: Miguel Morris. MRN: [id number] DOB/AGE: 23-Jul-1950 66 y.o.  Admit date: 05/21/2016 Discharge date: 05/22/2016  Admission Diagnoses:  Principal Problem:   Unilateral primary osteoarthritis, right hip Active Problems:   Status post total replacement of right hip   Discharge Diagnoses:  Same  Past Medical History:  Diagnosis Date  . Arthritis    oa and pain both knees  . Cancer [facility])    prostate  . GERD (gastroesophageal reflux disease)    TUMS EVERY NIGHT  . Heart murmur    STATES BORN WITH HEART MURMUR BUT NO LONGER HAS THE MURMUR  . Hx of blood clots    Bli LE  . Hypertension   . Pre-diabetes   . Prostate enlargement    HX OF ELEVATED PSA - DR. TANNENBAUM IS PT'[S UROLOGIST    Surgeries: Procedure(s): RIGHT TOTAL HIP ARTHROPLASTY ANTERIOR APPROACH on 05/21/2016   Consultants:   Discharged Condition: Improved  Hospital Course: Miguel Morris. is an 66 y.o. male who was admitted 05/21/2016 for operative treatment ofUnilateral primary osteoarthritis, right hip. Patient has severe unremitting pain that affects sleep, daily activities, and work/hobbies. After pre-op clearance the patient was taken to the operating room on 05/21/2016 and underwent  Procedure(s): RIGHT TOTAL HIP ARTHROPLASTY ANTERIOR APPROACH.    Patient was given perioperative antibiotics: Anti-infectives    Start     Dose/Rate Route Frequency Ordered Stop   05/21/16 1600  ceFAZolin (ANCEF) IVPB 1 g/50 mL premix     1 g 100 mL/hr over 30 Minutes Intravenous Every 6 hours 05/21/16 1348 05/21/16 2214   05/21/16 0755  ceFAZolin (ANCEF) IVPB 2g/100 mL premix     2 g 200 mL/hr over 30 Minutes Intravenous On call to O.R. 05/21/16 [id number] 05/21/16 0940       Patient was given sequential compression devices, early ambulation, and chemoprophylaxis to prevent DVT.  Patient benefited maximally from hospital stay and there were no complications.    Recent vital signs: Patient Vitals for the past 24  hrs:  BP Temp Temp src Pulse Resp SpO2  05/22/16 0114 123/71 98 F (36.7 C) Oral (!) 58 16 97 %  05/21/16 2111 128/72 98.2 F (36.8 C) Oral (!) 59 16 98 %  05/21/16 1747 132/67 97.5 F (36.4 C) Oral 74 17 95 %  05/21/16 1638 125/68 97.6 F (36.4 C) Axillary 70 16 95 %  05/21/16 1540 136/75 98.8 F (37.1 C) Axillary 75 16 98 %  05/21/16 1436 (!) 142/80 97.7 F (36.5 C) Axillary 73 16 98 %  05/21/16 1330 125/84 - - 61 13 100 %  05/21/16 1317 - 97.5 F (36.4 C) - 63 (!) 23 100 %  05/21/16 1315 122/81 - - 63 19 100 %  05/21/16 1300 119/80 - - (!) 55 11 100 %     Recent laboratory studies:  Recent Labs  05/22/16 0544  WBC 7.1  HGB 9.6*  HCT 28.6*  PLT 215  NA 137  K 3.5  CL 103  CO2 28  BUN 19  CREATININE 0.80  GLUCOSE 116*  CALCIUM 8.0*     Discharge Medications:   Allergies as of 05/22/2016   No Known Allergies     Medication List    TAKE these medications   amLODipine 10 MG tablet Commonly known as:  NORVASC Take 10 mg by mouth daily.   aspirin 81 MG chewable tablet Chew 1 tablet (81 mg total) by mouth 2 (two) times daily.  cyclobenzaprine 10 MG tablet Commonly known as:  FLEXERIL Take 1 tablet (10 mg total) by mouth 3 (three) times daily as needed for muscle spasms.   Fish Oil 500 MG Caps Take 500-1,000 mg by mouth daily.   FLUoxetine 40 MG capsule Commonly known as:  PROZAC Take 40 mg by mouth daily.   hydrochlorothiazide 12.5 MG capsule Commonly known as:  MICROZIDE Take 12.5 mg by mouth daily.   losartan 50 MG tablet Commonly known as:  COZAAR Take 100 mg by mouth daily.   naproxen sodium 220 MG tablet Commonly known as:  ANAPROX Take 440 mg by mouth daily.   nystatin powder Generic drug:  nystatin Apply 1 g topically 3 (three) times daily as needed (irritated skin).   potassium chloride 10 MEQ tablet Commonly known as:  K-DUR Take 1 tablet (10 mEq total) by mouth daily.   pramipexole 0.25 MG tablet Commonly known as:   MIRAPEX Take 0.25 mg by mouth at bedtime.   TUMS PO Take 1-2 tablets by mouth at bedtime as needed (for hearburn/indigestion).   vitamin C 500 MG tablet Commonly known as:  ASCORBIC ACID Take 500 mg by mouth 3 (three) times daily.            Durable Medical Equipment        Start     Ordered   05/21/16 1348  DME Walker rolling  Once    Question:  Patient needs a walker to treat with the following condition  Answer:  Status post total replacement of right hip   05/21/16 1348   05/21/16 1348  DME 3 n 1  Once     05/21/16 1348      Diagnostic Studies: Dg Pelvis Portable  Result Date: 05/21/2016 CLINICAL DATA:  Status post right hip replacement. EXAM: PORTABLE PELVIS 1-2 VIEWS COMPARISON:  None. FINDINGS: The femoral and acetabular components appear to be well situated. No fracture or dislocation is noted. Expected postoperative changes are seen involving the surrounding soft tissues. IMPRESSION: Status post right total hip arthroplasty. Electronically Signed   By: Marijo Conception, M.D.   On: 05/21/2016 12:04   Dg C-arm 1-60 Min-no Report  Result Date: 05/21/2016 Fluoroscopy was utilized by the requesting physician.  No radiographic interpretation.   Dg Hip Operative Unilat With Pelvis Right  Result Date: 05/21/2016 CLINICAL DATA:  Intraoperative imaging for right hip replacement. EXAM: OPERATIVE RIGHT HIP (WITH PELVIS IF PERFORMED) 2 VIEWS TECHNIQUE: Fluoroscopic spot image(s) were submitted for interpretation post-operatively. COMPARISON:  None. FINDINGS: Fluoroscopic spot view of the low pelvis and right hip demonstrate a right total hip arthroplasty in place. The device is located. No fracture. IMPRESSION: Right total hip replacement.  No acute finding. Electronically Signed   By: Inge Rise M.D.   On: 05/21/2016 10:47   Xr Knee 1-2 Views Right  Result Date: 04/28/2016 An AP and lateral of the right knee are obtained and using x-rays AP of both knees on that view. I seen  no, getting features of either knee. The implants appear well seated with good alignment. There is no effusion and noticed a fracture of either knee.  Xr Pelvis 1-2 Views  Result Date: 04/28/2016 AP pelvis shows severe osteoarthritis of his right hip. There is significant sclerotic changes. There is cystic changes and acetabulum. There is complete joint space loss of the superior lateral aspect. This particular osteophytes as well.   Disposition: 06-Home-Health Care Svc  Discharge Instructions    Discharge patient  Complete by:  As directed    Discharge disposition:  01-Home or Self Care   Discharge patient date:  05/22/2016      Follow-up Information    Mcarthur Rossetti, MD Follow up in 2 week(s).   Specialty:  Orthopedic Surgery Contact information: [place] [address] [phone]            Signed: Mcarthur Rossetti 05/22/2016, 12:53 PM

## 2016-05-22 NOTE — Progress Notes (Signed)
Physical Therapy Treatment Patient Details Name: Miguel Morris. MRN: [id number] DOB: 06/09/1950 Today's Date: 05/22/2016    History of Present Illness Pt s/p R THR and with hx of bil TKR    PT Comments    Pt progressing very well with mobility and eager for dc home.  Follow Up Recommendations  Home health PT     Equipment Recommendations  None recommended by PT    Recommendations for Other Services OT consult     Precautions / Restrictions Precautions Precautions: Fall Restrictions Weight Bearing Restrictions: No Other Position/Activity Restrictions: WBAT    Mobility  Bed Mobility Overal bed mobility: Needs Assistance Bed Mobility: Supine to Sit     Supine to sit: Supervision     General bed mobility comments: oob by PT  Transfers Overall transfer level: Needs assistance Equipment used: Rolling walker (2 wheeled) Transfers: Sit to/from Stand Sit to Stand: Supervision         General transfer comment: cues for LE management and use of UEs to self assist  Ambulation/Gait Ambulation/Gait assistance: Min guard Ambulation Distance (Feet): 200 Feet Assistive device: Rolling walker (2 wheeled) Gait Pattern/deviations: Decreased step length - right;Decreased step length - left;Shuffle;Trunk flexed;Step-to pattern;Step-through pattern Gait velocity: decr Gait velocity interpretation: Below normal speed for age/gender General Gait Details: cues for sequence, posture and position from [employer]            Wheelchair Mobility    Modified Rankin (Stroke Patients Only)       Balance                                    Cognition Arousal/Alertness: Awake/alert Behavior During Therapy: WFL for tasks assessed/performed Overall Cognitive Status: Within Functional Limits for tasks assessed                      Exercises Total Joint Exercises Ankle Circles/Pumps: AROM;Both;15 reps;Supine Quad Sets: AROM;Both;15  reps;Supine Heel Slides: AAROM;Right;20 reps;Supine Hip ABduction/ADduction: AAROM;Right;15 reps;Supine Long Arc Quad: AROM;Right;10 reps;Seated    General Comments        Pertinent Vitals/Pain Pain Assessment: 0-10 Pain Score: 3  Pain Location: R hip Pain Descriptors / Indicators: Aching;Sore;Tightness Pain Intervention(s): Limited activity within patient's tolerance;Monitored during session;Premedicated before session;Ice applied    Home Living Family/patient expects to be discharged to:: Private residence Living Arrangements: Spouse/significant other Available Help at Discharge: Family         Home Equipment: Gilford Rile - 2 wheels Additional Comments: pt will borrow a 3:1.  He will have someone get shower seat out of his attic    Prior Function Level of Independence: Independent          PT Goals (current goals can now be found in the care plan section) Acute Rehab PT Goals Patient Stated Goal: Regain ind PT Goal Formulation: With patient Time For Goal Achievement: 05/26/16 Potential to Achieve Goals: Good Progress towards PT goals: Progressing toward goals    Frequency    7X/week      PT Plan Current plan remains appropriate    Co-evaluation             End of Session Equipment Utilized During Treatment: Gait belt Activity Tolerance: Patient tolerated treatment well Patient left: in chair;with call bell/phone within reach;with chair alarm set;with nursing/sitter in room;with family/visitor present     Time: [id number] PT Time Calculation (min) (ACUTE  ONLY): 30 min  Charges:  $Gait Training: 8-22 mins $Therapeutic Exercise: 8-22 mins                    G Codes:      Lunabelle Oatley 06/10/2016, 12:08 PM

## 2016-05-22 NOTE — Discharge Instructions (Signed)

## 2016-05-22 NOTE — Op Note (Deleted)
  The note originally documented on this encounter has been moved the the encounter in which it belongs.  

## 2016-05-22 NOTE — Evaluation (Signed)
Occupational Therapy Evaluation Patient Details Name: Christoval Heckmann. MRN: [id number] DOB: 04-17-1951 Today's Date: 05/22/2016    History of Present Illness Pt s/p R THR and with hx of bil TKR   Clinical Impression   Pt was admitted for the above sx. All education was completed.  No further OT is needed at this time    Follow Up Recommendations  Supervision/Assistance - 24 hour    Equipment Recommendations  None recommended by OT    Recommendations for Other Services       Precautions / Restrictions Precautions Precautions: Fall Restrictions Weight Bearing Restrictions: No Other Position/Activity Restrictions: WBAT      Mobility Bed Mobility Overal bed mobility: Needs Assistance Bed Mobility: Supine to Sit     Supine to sit: Supervision     General bed mobility comments: oob by PT  Transfers Overall transfer level: Needs assistance Equipment used: Rolling walker (2 wheeled) Transfers: Sit to/from Stand Sit to Stand: Supervision         General transfer comment: cues for LE management and use of UEs to self assist    Balance                                            ADL Overall ADL's : Needs assistance/impaired                         Toilet Transfer: Min guard;Ambulation;BSC;RW   Toileting- Clothing Manipulation and Hygiene: Supervision/safety;Sit to/from stand   Tub/ Shower Transfer: Walk-in shower;Min guard;Ambulation     General ADL Comments: practiced shower transfer, stepping in forward as pt does not like stepping backwards.  wife can assist with adls as needed     Vision     Perception     Praxis      Pertinent Vitals/Pain Pain Assessment: 0-10 Pain Score: 1 Pain Location: R hip Pain Descriptors / Indicators: Aching;Sore;Tightness Pain Intervention(s): Limited activity within patient's tolerance;Monitored during session;Premedicated before session;Ice applied     Hand Dominance Right    Extremity/Trunk Assessment Upper Extremity Assessment Upper Extremity Assessment: Overall WFL for tasks assessed           Communication Communication Communication: No difficulties   Cognition Arousal/Alertness: Awake/alert Behavior During Therapy: WFL for tasks assessed/performed Overall Cognitive Status: Within Functional Limits for tasks assessed                     General Comments       Exercises       Shoulder Instructions      Home Living Family/patient expects to be discharged to:: Private residence Living Arrangements: Spouse/significant other Available Help at Discharge: Family               Bathroom Shower/Tub: Walk-in Psychologist, prison and probation services: Standard     Home Equipment: Environmental consultant - 2 wheels   Additional Comments: pt will borrow a 3:1.  He will have someone get shower seat out of his attic      Prior Functioning/Environment Level of Independence: Independent                 OT Problem List:     OT Treatment/Interventions:      OT Goals(Current goals can be found in the care plan section) Acute Rehab OT Goals Patient Stated Goal: Regain  ind OT Goal Formulation: All assessment and education complete, DC therapy  OT Frequency:     Barriers to D/C:            Co-evaluation              End of Session    Activity Tolerance: Patient tolerated treatment well Patient left: in chair;with call bell/phone within reach;with family/visitor present   Time: [id number] OT Time Calculation (min): 21 min Charges:  OT General Charges $OT Visit: 1 Procedure OT Evaluation $OT Eval Low Complexity: 1 Procedure G-Codes:    Jahmeek Shirk 06-19-2016, 12:23 PM  Lesle Chris, OTR/L [phone] 06-19-2016

## 2016-05-22 NOTE — Progress Notes (Signed)
Patient ID: Miguel Morris., male   DOB: [birth year], 66 y.o.   MRN: [id number] Patient ambulating well up and down the hallway today without problems. Anticipate discharge this afternoon. Dr. Ninfa Linden to stop by this afternoon to evaluate.

## 2016-05-28 ENCOUNTER — Encounter (INDEPENDENT_AMBULATORY_CARE_PROVIDER_SITE_OTHER): Payer: Self-pay | Admitting: Surgery

## 2016-05-28 ENCOUNTER — Ambulatory Visit (INDEPENDENT_AMBULATORY_CARE_PROVIDER_SITE_OTHER): Payer: Medicare Other | Admitting: Surgery

## 2016-05-28 VITALS — BP 119/72 | HR 80 | Ht 72.0 in | Wt 198.0 lb

## 2016-05-28 DIAGNOSIS — M96842 Postprocedural seroma of a musculoskeletal structure following a musculoskeletal system procedure: Secondary | ICD-10-CM

## 2016-05-28 NOTE — Progress Notes (Signed)
   Post-Op Visit Note   Patient: Miguel Morris.           Date of Birth: 1950-10-11           MRN: [id number] Visit Date: 05/28/2016 PCP: Gennette Pac, MD   Assessment & Plan:  Chief Complaint:  Chief Complaint  Patient presents with  . Right Hip - Wound Check   Visit Diagnoses:  1. Postoperative seroma of musculoskeletal structure after musculoskeletal procedure     Plan: Patient will follow in the office with Dr. Ninfa Linden as scheduled. All questions answered. Benita Stabile PA-C aspirated 80cc of serous fluid from right hip. This was done under sterile conditions. Patient tolerated procedure well without, condition.  Follow-Up Instructions: Return for As scheduled with Dr. Ninfa Linden.   Orders:  No orders of the defined types were placed in this encounter.  No orders of the defined types were placed in this encounter.  HPI Patient presents with swelling lateral right hip. He is status post right total hip arthroplasty, anterior approach on 05/21/2016 with Dr. Ninfa Linden. He denies any pain. He has not had to take Oxycodone or Flexeril since the first night. He ambulates with a cane. Dressing from surgery is still intact.   Imaging: No results found.  PMFS History: Patient Active Problem List   Diagnosis Date Noted  . Unilateral primary osteoarthritis, right hip 05/21/2016  . Status post total replacement of right hip 05/21/2016  . Left ventricular hypertrophy 05/18/2016  . Essential hypertension 05/18/2016  . Status post bilateral knee replacements 11/27/2013  . Degenerative arthritis of knee, bilateral 11/23/2013  . Status post total bilateral knee replacement 11/23/2013   Past Medical History:  Diagnosis Date  . Arthritis    oa and pain both knees  . Cancer [facility])    prostate  . GERD (gastroesophageal reflux disease)    TUMS EVERY NIGHT  . Heart murmur    STATES BORN WITH HEART MURMUR BUT NO LONGER HAS THE MURMUR  . Hx of blood clots    Bli LE  .  Hypertension   . Pre-diabetes   . Prostate enlargement    HX OF ELEVATED PSA - DR. TANNENBAUM IS PT'[S UROLOGIST    Family History  Problem Relation Age of Onset  . Adopted: Yes  . Stroke Paternal Grandfather     Past Surgical History:  Procedure Laterality Date  . BROKEN CLAVICLE 1988    . TONSILLECTOMY     AS A CHILD  . TOTAL HIP ARTHROPLASTY Right 05/21/2016   Procedure: RIGHT TOTAL HIP ARTHROPLASTY ANTERIOR APPROACH;  Surgeon: Mcarthur Rossetti, MD;  Location: [facility];  Service: Orthopedics;  Laterality: Right;  . TOTAL KNEE ARTHROPLASTY Bilateral 11/23/2013   Procedure: TOTAL KNEE BILATERAL;  Surgeon: Mcarthur Rossetti, MD;  Location: [facility];  Service: Orthopedics;  Laterality: Bilateral;   Social History   Occupational History  . Not on file.   Social History Main Topics  . Smoking status: Former Research scientist (life sciences)  . Smokeless tobacco: Never Used     Comment: quit 35 years ago  . Alcohol use Yes     Comment: former 2-4 drinks quit 3 weeks ago  . Drug use: No  . Sexual activity: Yes   Exam:  Right hip he does have some anterolateral swelling. Surgical incision is healing well. Steri-Strips intact. No drainage or gross signs of infection. He does have what appears to be a large seroma. Calf is nontender and he is neurovascularly intact.

## 2016-06-01 ENCOUNTER — Other Ambulatory Visit: Payer: Medicare HMO

## 2016-06-02 DIAGNOSIS — I10 Essential (primary) hypertension: Secondary | ICD-10-CM | POA: Diagnosis not present

## 2016-06-02 DIAGNOSIS — Z8739 Personal history of other diseases of the musculoskeletal system and connective tissue: Secondary | ICD-10-CM | POA: Diagnosis not present

## 2016-06-03 ENCOUNTER — Encounter (INDEPENDENT_AMBULATORY_CARE_PROVIDER_SITE_OTHER): Payer: Self-pay | Admitting: Orthopaedic Surgery

## 2016-06-03 ENCOUNTER — Ambulatory Visit (INDEPENDENT_AMBULATORY_CARE_PROVIDER_SITE_OTHER): Payer: PRIVATE HEALTH INSURANCE | Admitting: Orthopaedic Surgery

## 2016-06-03 DIAGNOSIS — Z96641 Presence of right artificial hip joint: Secondary | ICD-10-CM

## 2016-06-03 NOTE — Progress Notes (Signed)
The patient is 2 weeks tomorrow status post a right total hip replacement. Exam of lidocaine is doing well overall. He is only taking Tylenol for pain. He's been on aspirin twice day.  On examination his hip incision looks good. Removed the the old Steri-Strips and put new Steri-Strips on appears of his infection. He does have a large seroma. I was able to clean hip with Betadine and alcohol and then aspirate 120 to 130 mL of serosanguineous fluid off the hip. This was consistent with a seroma.  At this point he will continue to increase his activities as he tolerates. I'll see him back in 2 weeks to see how is doing overall mainly just to check on any reaccumulation of seroma. He can try heating pad as well. No x-rays needed at his next visit.

## 2016-06-08 ENCOUNTER — Ambulatory Visit (HOSPITAL_COMMUNITY): Payer: Medicare Other | Attending: Cardiovascular Disease

## 2016-06-08 ENCOUNTER — Other Ambulatory Visit: Payer: Medicare Other | Admitting: *Deleted

## 2016-06-08 ENCOUNTER — Other Ambulatory Visit: Payer: Self-pay

## 2016-06-08 DIAGNOSIS — I517 Cardiomegaly: Secondary | ICD-10-CM

## 2016-06-08 DIAGNOSIS — I351 Nonrheumatic aortic (valve) insufficiency: Secondary | ICD-10-CM | POA: Diagnosis not present

## 2016-06-08 DIAGNOSIS — I10 Essential (primary) hypertension: Secondary | ICD-10-CM | POA: Insufficient documentation

## 2016-06-08 DIAGNOSIS — I071 Rheumatic tricuspid insufficiency: Secondary | ICD-10-CM | POA: Diagnosis not present

## 2016-06-08 DIAGNOSIS — I371 Nonrheumatic pulmonary valve insufficiency: Secondary | ICD-10-CM | POA: Diagnosis not present

## 2016-06-08 LAB — BASIC METABOLIC PANEL
BUN / CREAT RATIO: 18 (ref 10–24)
BUN: 14 mg/dL (ref 8–27)
CO2: 26 mmol/L (ref 18–29)
Calcium: 8.7 mg/dL (ref 8.6–10.2)
Chloride: 98 mmol/L (ref 96–106)
Creatinine, Ser: 0.76 mg/dL (ref 0.76–1.27)
GFR calc non Af Amer: 96 (ref >59)
GFR, EST AFRICAN AMERICAN: 111 (ref >59)
Glucose: 85 mg/dL (ref 65–99)
Potassium: 4.3 mmol/L (ref 3.5–5.2)
SODIUM: 141 mmol/L (ref 134–144)

## 2016-06-17 ENCOUNTER — Ambulatory Visit (INDEPENDENT_AMBULATORY_CARE_PROVIDER_SITE_OTHER): Payer: PRIVATE HEALTH INSURANCE | Admitting: Orthopaedic Surgery

## 2016-06-17 DIAGNOSIS — Z96641 Presence of right artificial hip joint: Secondary | ICD-10-CM

## 2016-06-17 NOTE — Progress Notes (Signed)
The patient is now 4 weeks status post a right total hip arthroplasty. He is doing much better overall.  On exam his incision looks great. He does still have a moderate seroma. I was able to aspirate 65-70 mL of fluid from his hip area. His leg lengths are equal. He has essentially no pain with range of motion of his hip.  A continued increase his activities. I'll see him back one more time in 4 weeks. If he looks good at that visit we will need to see him back for a year postop. He does not need x-rays at next visit.

## 2016-07-12 DIAGNOSIS — Z79899 Other long term (current) drug therapy: Secondary | ICD-10-CM | POA: Diagnosis not present

## 2016-07-15 ENCOUNTER — Ambulatory Visit (INDEPENDENT_AMBULATORY_CARE_PROVIDER_SITE_OTHER): Payer: PRIVATE HEALTH INSURANCE | Admitting: Orthopaedic Surgery

## 2016-07-20 DIAGNOSIS — C61 Malignant neoplasm of prostate: Secondary | ICD-10-CM | POA: Diagnosis not present

## 2016-07-28 ENCOUNTER — Encounter: Payer: Self-pay | Admitting: Cardiovascular Disease

## 2016-08-03 ENCOUNTER — Ambulatory Visit (INDEPENDENT_AMBULATORY_CARE_PROVIDER_SITE_OTHER): Payer: Medicare Other | Admitting: Orthopaedic Surgery

## 2016-08-03 DIAGNOSIS — Z96641 Presence of right artificial hip joint: Secondary | ICD-10-CM

## 2016-08-03 NOTE — Progress Notes (Signed)
The patient is now 2 months status post a right total hip arthroplasty. He said is doing well. He's 2-1/2 years out from bilateral knee replacements. He said needed none of these have issues at all.  On examination his hip moves smoothly on the right side. Both knees move well. There is no issues on exam.  We'll see him back in 4 months to see how is doing overall. I like a low AP pelvis and a bilateral of AP and lateral both knees. If he has any issues let us know. He'll increase activities as tolerated.

## 2016-08-16 ENCOUNTER — Ambulatory Visit (INDEPENDENT_AMBULATORY_CARE_PROVIDER_SITE_OTHER): Payer: Medicare Other | Admitting: Cardiovascular Disease

## 2016-08-16 ENCOUNTER — Encounter: Payer: Self-pay | Admitting: Cardiovascular Disease

## 2016-08-16 VITALS — BP 138/85 | HR 66 | Ht 72.0 in | Wt 203.1 lb

## 2016-08-16 DIAGNOSIS — I10 Essential (primary) hypertension: Secondary | ICD-10-CM | POA: Diagnosis not present

## 2016-08-16 DIAGNOSIS — Z13220 Encounter for screening for lipoid disorders: Secondary | ICD-10-CM | POA: Diagnosis not present

## 2016-08-16 NOTE — Progress Notes (Signed)
Cardiology Office Note   Date:  08/16/2016   ID:  Miguel Nettles., DOB 01/16/1951, MRN [id number]  PCP:  Gennette Pac, MD  Cardiologist:   Mertie Moores, MD   Chief Complaint  Patient presents with  . Follow-up    HTN      History of Present Illness: Miguel Peddie. is a 66 y.o. male who presents for  HTN Seen with wife, [name] .   HTN for years.  Did not pay attention to his BP for years.  Only recently Started taking attention to his diet. He admits that he gets stressed out when he comes to the doctor because he knows he only got blood drawn. He's Stubborn - he says Gets stressed out when he comes to the doctor   BP at home has been normal for years but recently his BP readings have been elevated.   Used to exercise regularly - now has hip issues and cannot exercise as much  Able to to yard work - shoveled snow for hours several weeks ago .   Scheduled for hip replacement on Friday .   Tries to avoid salt.   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   Doing well  No CP while doing strenuous yard work .    Past Medical History:  Diagnosis Date  . Arthritis    oa and pain both knees  . Cancer [facility])    prostate  . GERD (gastroesophageal reflux disease)    TUMS EVERY NIGHT  . Heart murmur    STATES BORN WITH HEART MURMUR BUT NO LONGER HAS THE MURMUR  . Hx of blood clots    Bli LE  . Hypertension   . Pre-diabetes   . Prostate enlargement    HX OF ELEVATED PSA - DR. TANNENBAUM IS PT'[S UROLOGIST    Past Surgical History:  Procedure Laterality Date  . BROKEN CLAVICLE 1988    . TONSILLECTOMY     AS A CHILD  . TOTAL HIP ARTHROPLASTY Right 05/21/2016   Procedure: RIGHT TOTAL HIP ARTHROPLASTY ANTERIOR APPROACH;  Surgeon: Mcarthur Rossetti, MD;  Location: [facility] ORS;  Service: Orthopedics;  Laterality: Right;  . TOTAL KNEE  ARTHROPLASTY Bilateral 11/23/2013   Procedure: TOTAL KNEE BILATERAL;  Surgeon: Mcarthur Rossetti, MD;  Location: [facility] ORS;  Service: Orthopedics;  Laterality: Bilateral;     Current Outpatient Prescriptions  Medication Sig Dispense Refill  . amLODipine (NORVASC) 10 MG tablet Take 10 mg by mouth daily.    . Calcium Carbonate Antacid (TUMS PO) Take 1-2 tablets by mouth at bedtime as needed (for hearburn/indigestion).     Marland Kitchen FLUoxetine (PROZAC) 40 MG capsule Take 40 mg by mouth daily.    . hydrochlorothiazide (MICROZIDE) 12.5 MG capsule Take 12.5 mg by mouth daily.    Marland Kitchen losartan (COZAAR) 50 MG tablet Take 100 mg by mouth daily.    . potassium chloride (K-DUR) 10 MEQ tablet Take 1 tablet (10 mEq total) by mouth daily. 30 tablet 11  . pramipexole (MIRAPEX) 0.25 MG tablet Take 0.25 mg by mouth at bedtime.     . terbinafine (LAMISIL) 250 MG tablet Take 250 mg by mouth daily.  0  . vitamin C (ASCORBIC ACID) 500 MG tablet Take 500 mg by mouth 3 (three) times daily.  No current facility-administered medications for this visit.     Allergies:   Patient has no known allergies.    Social History:  The patient  reports that he has quit smoking. He has never used smokeless tobacco. He reports that he drinks alcohol. He reports that he does not use drugs.   Family History:  The patient's family history includes Stroke in his paternal grandfather. He was adopted.    ROS:  Please see the history of present illness.    Review of Systems: Constitutional:  denies fever, chills, diaphoresis, appetite change and fatigue.  HEENT: denies photophobia, eye pain, redness, hearing loss, ear pain, congestion, sore throat, rhinorrhea, sneezing, neck pain, neck stiffness and tinnitus.  Respiratory: denies SOB, DOE, cough, chest tightness, and wheezing.  Cardiovascular: denies chest pain, palpitations and leg swelling.  Gastrointestinal: denies nausea, vomiting, abdominal pain, diarrhea, constipation, blood in  stool.  Genitourinary: denies dysuria, urgency, frequency, hematuria, flank pain and difficulty urinating.  Musculoskeletal: denies  myalgias, back pain, joint swelling, arthralgias and gait problem.   Skin: denies pallor, rash and wound.  Neurological: denies dizziness, seizures, syncope, weakness, light-headedness, numbness and headaches.   Hematological: denies adenopathy, easy bruising, personal or family bleeding history.  Psychiatric/ Behavioral: denies suicidal ideation, mood changes, confusion, nervousness, sleep disturbance and agitation.       All other systems are reviewed and negative.    PHYSICAL EXAM: VS:  BP 138/85 (BP Location: Right Arm, Cuff Size: Large)   Pulse 66   Ht 6' (1.829 m)   Wt 203 lb 1.9 oz (92.1 kg)   SpO2 98%   BMI 27.55 kg/m  , BMI Body mass index is 27.55 kg/m. GEN: Well nourished, well developed, in no acute distress  HEENT: normal  Neck: no JVD, carotid bruits, or masses Cardiac: RRR; no murmurs, rubs, or gallops,no edema  Respiratory:  clear to auscultation bilaterally, normal work of breathing GI: soft, nontender, nondistended, + BS MS: no deformity or atrophy  Skin: warm and dry, no rash Neuro:  Strength and sensation are intact Psych: normal   EKG:  EKG is ordered today. The ekg ordered today demonstrates  NSR at 61.   1st degree AV block    Recent Labs: 05/22/2016: Hemoglobin 9.6; Platelets 215 06/08/2016: BUN 14; Creatinine, Ser 0.76; Potassium 4.3; Sodium 141    Lipid Panel No results found for: CHOL, TRIG, HDL, CHOLHDL, VLDL, LDLCALC, LDLDIRECT    Wt Readings from Last 3 Encounters:  08/16/16 203 lb 1.9 oz (92.1 kg)  05/28/16 198 lb (89.8 kg)  05/21/16 195 lb 1.7 oz (88.5 kg)      Other studies Reviewed: Additional studies/ records that were reviewed today include: . Review of the above records demonstrates:    ASSESSMENT AND PLAN:  1.  HTN:   Continue meds for now His ECG and his primary medical doctor's office  suggested left ventricular hypertrophy. No evidence of LVH on our ECG .  Will get an echo to evaluate that further .   Is still eating lots of salty foods. Breakfast burrito this am  , pizza yesterday for lunch .  Fast food frequently for lunch  Advised him to back down on the salty foods.    2. Possible sleeping apnea:    Snores,  Has apneac episodes.  Wants to wait and schedule sleep study later.     Current medicines are reviewed at length with the patient today.  The patient does not have concerns regarding medicines.  Labs/ tests ordered today include:  No orders of the defined types were placed in this encounter.  Disposition:   FU with me in 6 months     Mertie Moores, MD  08/16/2016 9:26 AM    [address], [place],   [address] Phone: [phone]; Fax: [phone]

## 2016-08-16 NOTE — Patient Instructions (Signed)
Medication Instructions:  Your physician recommends that you continue on your current medications as directed. Please refer to the Current Medication list given to you today.   Labwork: TODAY - cholesterol, complete metabolic panel   Testing/Procedures: None Ordered   Follow-Up: Your physician wants you to follow-up in: 6 months with Dr. Nahser.  You will receive a reminder letter in the mail two months in advance. If you don't receive a letter, please call our office to schedule the follow-up appointment.   If you need a refill on your cardiac medications before your next appointment, please call your pharmacy.   Thank you for choosing [facility]! Shermar Friedland, RN [phone]    

## 2016-08-17 LAB — COMPREHENSIVE METABOLIC PANEL
A/G RATIO: 1.8 (ref 1.2–2.2)
ALT: 20 IU/L (ref 0–44)
AST: 18 IU/L (ref 0–40)
Albumin: 4.2 g/dL (ref 3.6–4.8)
Alkaline Phosphatase: 83 IU/L (ref 39–117)
BUN/Creatinine Ratio: 22 (ref 10–24)
BUN: 18 mg/dL (ref 8–27)
Bilirubin Total: 0.5 mg/dL (ref 0.0–1.2)
CALCIUM: 9.3 mg/dL (ref 8.6–10.2)
CO2: 25 mmol/L (ref 18–29)
Chloride: 99 mmol/L (ref 96–106)
Creatinine, Ser: 0.83 mg/dL (ref 0.76–1.27)
GFR calc Af Amer: 107 mL/min/{1.73_m2} (ref >59)
GFR, EST NON AFRICAN AMERICAN: 92 mL/min/{1.73_m2} (ref >59)
Globulin, Total: 2.4 g/dL (ref 1.5–4.5)
Glucose: 83 mg/dL (ref 65–99)
POTASSIUM: 3.8 mmol/L (ref 3.5–5.2)
Sodium: 139 mmol/L (ref 134–144)
Total Protein: 6.6 g/dL (ref 6.0–8.5)

## 2016-08-17 LAB — LIPID PANEL
CHOL/HDL RATIO: 2.2 ratio (ref 0.0–5.0)
Cholesterol, Total: 185 mg/dL (ref 100–199)
HDL: 83 mg/dL (ref >39)
LDL Calculated: 92 mg/dL (ref 0–99)
TRIGLYCERIDES: 50 mg/dL (ref 0–149)
VLDL Cholesterol Cal: 10 mg/dL (ref 5–40)

## 2016-09-22 DIAGNOSIS — R7301 Impaired fasting glucose: Secondary | ICD-10-CM | POA: Diagnosis not present

## 2016-09-22 DIAGNOSIS — R0681 Apnea, not elsewhere classified: Secondary | ICD-10-CM | POA: Diagnosis not present

## 2016-09-22 DIAGNOSIS — R972 Elevated prostate specific antigen [PSA]: Secondary | ICD-10-CM | POA: Diagnosis not present

## 2016-09-22 DIAGNOSIS — I10 Essential (primary) hypertension: Secondary | ICD-10-CM | POA: Diagnosis not present

## 2016-09-22 DIAGNOSIS — Z8739 Personal history of other diseases of the musculoskeletal system and connective tissue: Secondary | ICD-10-CM | POA: Diagnosis not present

## 2016-09-22 DIAGNOSIS — B360 Pityriasis versicolor: Secondary | ICD-10-CM | POA: Diagnosis not present

## 2016-09-22 DIAGNOSIS — F338 Other recurrent depressive disorders: Secondary | ICD-10-CM | POA: Diagnosis not present

## 2016-10-27 DIAGNOSIS — S81811A Laceration without foreign body, right lower leg, initial encounter: Secondary | ICD-10-CM | POA: Diagnosis not present

## 2016-11-16 DIAGNOSIS — R0681 Apnea, not elsewhere classified: Secondary | ICD-10-CM | POA: Diagnosis not present

## 2016-11-16 DIAGNOSIS — G4750 Parasomnia, unspecified: Secondary | ICD-10-CM | POA: Diagnosis not present

## 2016-11-16 DIAGNOSIS — G2581 Restless legs syndrome: Secondary | ICD-10-CM | POA: Diagnosis not present

## 2016-11-17 DIAGNOSIS — Z8739 Personal history of other diseases of the musculoskeletal system and connective tissue: Secondary | ICD-10-CM | POA: Diagnosis not present

## 2016-11-17 DIAGNOSIS — H6123 Impacted cerumen, bilateral: Secondary | ICD-10-CM | POA: Diagnosis not present

## 2016-11-17 DIAGNOSIS — I10 Essential (primary) hypertension: Secondary | ICD-10-CM | POA: Diagnosis not present

## 2016-12-02 ENCOUNTER — Ambulatory Visit (INDEPENDENT_AMBULATORY_CARE_PROVIDER_SITE_OTHER): Payer: Medicare Other

## 2016-12-02 ENCOUNTER — Ambulatory Visit (INDEPENDENT_AMBULATORY_CARE_PROVIDER_SITE_OTHER): Payer: Medicare Other | Admitting: Orthopaedic Surgery

## 2016-12-02 DIAGNOSIS — M25561 Pain in right knee: Secondary | ICD-10-CM

## 2016-12-02 DIAGNOSIS — Z96653 Presence of artificial knee joint, bilateral: Secondary | ICD-10-CM | POA: Diagnosis not present

## 2016-12-02 DIAGNOSIS — M25551 Pain in right hip: Secondary | ICD-10-CM | POA: Diagnosis not present

## 2016-12-02 DIAGNOSIS — Z96641 Presence of right artificial hip joint: Secondary | ICD-10-CM | POA: Diagnosis not present

## 2016-12-02 NOTE — Progress Notes (Signed)
  The patient is now 6 months status post a right total hip arthroplasty in 3 years status post bilateral total knee replacements. He is very active individual and has no pain at all. He says he does very well. He does not need assistive device when he walks. He is very active 66 years old. He denies any swelling in his knees or any issues at all.  On examination of his right hip is incisions well-healed his range of motion of the hip is full. His Lachman's are equal. Both knees have well-healed incisions. There is no effusion of either knee. His knees are ligamentously stable full range of motion.  X-rays of his pelvis and his hips and his knees were reviewed and show well-seated implant with no, getting features.  At this point he'll continue increase activities as comfort allows and he'll follow-up as needed for his joints. We can always see him for anything else. I told him in detail about things and would bring him back to see [place] for the joint replacements we can always again reevaluated anything at any time. All questions were encouraged and answered. He'll follow up as needed.

## 2017-02-02 DIAGNOSIS — E663 Overweight: Secondary | ICD-10-CM | POA: Diagnosis not present

## 2017-02-02 DIAGNOSIS — Z23 Encounter for immunization: Secondary | ICD-10-CM | POA: Diagnosis not present

## 2017-02-02 DIAGNOSIS — S0990XS Unspecified injury of head, sequela: Secondary | ICD-10-CM | POA: Diagnosis not present

## 2017-03-04 ENCOUNTER — Ambulatory Visit (INDEPENDENT_AMBULATORY_CARE_PROVIDER_SITE_OTHER): Payer: Medicare Other | Admitting: Cardiovascular Disease

## 2017-03-04 ENCOUNTER — Encounter: Payer: Self-pay | Admitting: Cardiovascular Disease

## 2017-03-04 VITALS — BP 122/72 | HR 56 | Ht 72.0 in | Wt 207.0 lb

## 2017-03-04 DIAGNOSIS — I10 Essential (primary) hypertension: Secondary | ICD-10-CM | POA: Diagnosis not present

## 2017-03-04 LAB — BASIC METABOLIC PANEL
BUN / CREAT RATIO: 17 (ref 10–24)
BUN: 15 mg/dL (ref 8–27)
CO2: 28 mmol/L (ref 20–29)
CREATININE: 0.86 mg/dL (ref 0.76–1.27)
Calcium: 9.3 mg/dL (ref 8.6–10.2)
Chloride: 98 mmol/L (ref 96–106)
GFR calc Af Amer: 104 mL/min/{1.73_m2} (ref >59)
GFR, EST NON AFRICAN AMERICAN: 90 mL/min/{1.73_m2} (ref >59)
GLUCOSE: 95 mg/dL (ref 65–99)
POTASSIUM: 4.2 mmol/L (ref 3.5–5.2)
SODIUM: 139 mmol/L (ref 134–144)

## 2017-03-04 NOTE — Progress Notes (Signed)
Cardiology Office Note   Date:  03/04/2017   ID:  Dorthula Nettles., DOB 1951/03/10, MRN [id number]  PCP:  Hulan Fess, MD  Cardiologist:   Mertie Moores, MD   Chief Complaint  Patient presents with  . Hypertension      History of Present Illness: Miguel Morris. is a 66 y.o. male who presents for  HTN Seen with wife, [name] .   HTN for years.  Did not pay attention to his BP for years.  Only recently Started taking attention to his diet. He admits that he gets stressed out when he comes to the doctor because he knows he only got blood drawn. He's Stubborn - he says Gets stressed out when he comes to the doctor   BP at home has been normal for years but recently his BP readings have been elevated.   Used to exercise regularly - now has hip issues and cannot exercise as much  Able to to yard work - shoveled snow for hours several weeks ago .   Scheduled for hip replacement on Friday .   Tries to avoid salt.   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   Doing well  No CP while doing strenuous yard work .    March 04, 2017:  No CP or dyspnea. Is not exercising  Watching his salt intake  Has restless legs --  We had discussed  Sleep study at our last visit   Past Medical History:  Diagnosis Date  . Arthritis    oa and pain both knees  . Cancer [facility])    prostate  . GERD (gastroesophageal reflux disease)    TUMS EVERY NIGHT  . Heart murmur    STATES BORN WITH HEART MURMUR BUT NO LONGER HAS THE MURMUR  . Hx of blood clots    Bli LE  . Hypertension   . Pre-diabetes   . Prostate enlargement    HX OF ELEVATED PSA - DR. TANNENBAUM IS PT'[S UROLOGIST    Past Surgical History:  Procedure Laterality Date  . BROKEN CLAVICLE 1988    . RIGHT TOTAL HIP ARTHROPLASTY ANTERIOR APPROACH Right 05/21/2016   Performed by Mcarthur Rossetti, MD  at [facility] ORS  . TONSILLECTOMY     AS A CHILD  . TOTAL KNEE BILATERAL Bilateral 11/23/2013   Performed by Mcarthur Rossetti, MD at [facility] ORS     Current Outpatient Medications  Medication Sig Dispense Refill  . amLODipine (NORVASC) 10 MG tablet Take 10 mg by mouth daily.    . Calcium Carbonate Antacid (TUMS PO) Take 1-2 tablets by mouth at bedtime as needed (for hearburn/indigestion).     . hydrochlorothiazide (HYDRODIURIL) 25 MG tablet Take 25 mg daily by mouth.    . losartan (COZAAR) 100 MG tablet Take 100 mg daily by mouth.    . pramipexole (MIRAPEX) 0.25 MG tablet Take 0.25 mg by mouth at bedtime.     . vitamin C (ASCORBIC ACID) 500 MG tablet Take 500 mg by mouth 3 (three) times daily.    . potassium chloride (K-DUR) 10 MEQ tablet Take 1 tablet (10 mEq total) by mouth daily. 30 tablet 11   No current facility-administered medications for this visit.     Allergies:   Patient has no known allergies.  Social History:  The patient  reports that he has quit smoking. he has never used smokeless tobacco. He reports that he drinks alcohol. He reports that he does not use drugs.   Family History:  The patient's family history includes Stroke in his paternal grandfather. He was adopted.    ROS:  Please see the history of present illness.      All other systems are reviewed and negative.   Physical Exam: Blood pressure 122/72, pulse (!) 56, height 6' (1.829 m), weight 207 lb (93.9 kg), SpO2 98 %.  GEN:  Well nourished, well developed in no acute distress HEENT: Normal NECK: No JVD; No carotid bruits LYMPHATICS: No lymphadenopathy CARDIAC: RR, no murmurs, rubs, gallops RESPIRATORY:  Clear to auscultation without rales, wheezing or rhonchi  ABDOMEN: Soft, non-tender, non-distended MUSCULOSKELETAL:  No edema; No deformity  SKIN: Warm and dry NEUROLOGIC:  Alert and oriented x 3   EKG: March 04, 2017: Sinus bradycardia 53 beats a minute.  He has a first-degree AV block.   Otherwise EKG is normal.    Recent Labs: 05/22/2016: Hemoglobin 9.6; Platelets 215 08/16/2016: ALT 20; BUN 18; Creatinine, Ser 0.83; Potassium 3.8; Sodium 139    Lipid Panel    Component Value Date/Time   CHOL 185 08/16/2016 0939   TRIG 50 08/16/2016 0939   HDL 83 08/16/2016 0939   CHOLHDL 2.2 08/16/2016 0939   LDLCALC 92 08/16/2016 0939      Wt Readings from Last 3 Encounters:  03/04/17 207 lb (93.9 kg)  08/16/16 203 lb 1.9 oz (92.1 kg)  05/28/16 198 lb (89.8 kg)      Other studies Reviewed: Additional studies/ records that were reviewed today include: . Review of the above records demonstrates:    ASSESSMENT AND PLAN:  1.  HTN:     BP is well controlled  Continue current medications.  I have advised to exercise on a regular basis.  He may have sleep apnea which could be contributing to his hypertension.   2. Possible sleeping apnea:    He was scheduled for a sleep study but he postponed it.  His wife states that he has developed restless leg syndrome.  He will call Dr. Eddie Dibbles office and get another referral to the sleep center if needed.   Current medicines are reviewed at length with the patient today.  The patient does not have concerns regarding medicines.  Labs/ tests ordered today include:  No orders of the defined types were placed in this encounter.  Disposition:   FU with me in 6 months     Mertie Moores, MD  03/04/2017 10:00 AM    [address], [place], [place]  [address] Phone: [phone]; Fax: [phone]

## 2017-03-04 NOTE — Patient Instructions (Signed)
Medication Instructions:  Your physician recommends that you continue on your current medications as directed. Please refer to the Current Medication list given to you today.   Labwork: Your physician recommends that you have lab work today: BMET   Testing/Procedures: Patient will call Dr. Eddie Dibbles office whom in the past ordered patient's sleep study and get that test reordered.   Follow-Up: Your physician wants you to follow-up in: 6 months with Dr. Acie Fredrickson.  You will receive a reminder letter in the mail two months in advance. If you don't receive a letter, please call our office to schedule the follow-up appointment.   Any Other Special Instructions Will Be Listed Below (If Applicable).     If you need a refill on your cardiac medications before your next appointment, please call your pharmacy.

## 2017-03-07 DIAGNOSIS — R413 Other amnesia: Secondary | ICD-10-CM | POA: Diagnosis not present

## 2017-03-08 ENCOUNTER — Other Ambulatory Visit: Payer: Self-pay | Admitting: Neurology

## 2017-03-08 DIAGNOSIS — R413 Other amnesia: Secondary | ICD-10-CM

## 2017-03-09 ENCOUNTER — Ambulatory Visit
Admission: RE | Admit: 2017-03-09 | Discharge: 2017-03-09 | Disposition: A | Discharge status: Home / Self Care | Payer: Medicare Other | Source: Ambulatory Visit | Attending: Neurology | Admitting: Neurology

## 2017-03-09 DIAGNOSIS — R413 Other amnesia: Secondary | ICD-10-CM

## 2017-03-15 ENCOUNTER — Other Ambulatory Visit: Payer: Self-pay | Admitting: Neurology

## 2017-03-15 DIAGNOSIS — R413 Other amnesia: Secondary | ICD-10-CM

## 2017-03-22 DIAGNOSIS — C61 Malignant neoplasm of prostate: Secondary | ICD-10-CM | POA: Diagnosis not present

## 2017-03-24 ENCOUNTER — Other Ambulatory Visit: Payer: Self-pay | Admitting: Urology

## 2017-04-01 DIAGNOSIS — C61 Malignant neoplasm of prostate: Secondary | ICD-10-CM | POA: Diagnosis not present

## 2017-04-01 DIAGNOSIS — R350 Frequency of micturition: Secondary | ICD-10-CM | POA: Diagnosis not present

## 2017-04-01 DIAGNOSIS — M6289 Other specified disorders of muscle: Secondary | ICD-10-CM | POA: Diagnosis not present

## 2017-04-01 DIAGNOSIS — M6281 Muscle weakness (generalized): Secondary | ICD-10-CM | POA: Diagnosis not present

## 2017-04-22 ENCOUNTER — Encounter (HOSPITAL_COMMUNITY): Payer: Self-pay

## 2017-04-22 DIAGNOSIS — M6289 Other specified disorders of muscle: Secondary | ICD-10-CM | POA: Diagnosis not present

## 2017-04-22 DIAGNOSIS — M6281 Muscle weakness (generalized): Secondary | ICD-10-CM | POA: Diagnosis not present

## 2017-04-22 DIAGNOSIS — R350 Frequency of micturition: Secondary | ICD-10-CM | POA: Diagnosis not present

## 2017-04-22 DIAGNOSIS — C61 Malignant neoplasm of prostate: Secondary | ICD-10-CM | POA: Diagnosis not present

## 2017-04-22 NOTE — Patient Instructions (Signed)
Your procedure is scheduled on: Monday, Jan. 14, 2018   Surgery Time:  11:30AM-3:00PM   Report to [facility]    Report to admitting at 9:00 AM    Call this number if you have problems the morning of surgery [phone]   Do not eat food or drink liquids :After Midnight.   Do NOT smoke after Midnight   8 OZ MAGNESIUM CITRATE BY NOON DAY BEFORE SURGERY   1 FLEET ENEMA NIGHT BEFORE SURGERY    Take these medicines the morning of surgery with A SIP OF WATER: Amlodipine                               You may not have any metal on your body including jewelry, and body piercings             Do not wear lotions, powders, perfumes/cologne, or deodorant                         Men may shave face and neck.   Do not bring valuables to the hospital. [place].   Contacts, dentures or bridgework may not be worn into surgery.   Leave suitcase in the car. After surgery it may be brought to your room.   Special Instructions: Bring a copy of your healthcare power of attorney and living will documents         the day of surgery if you haven't scanned them in before.              Please read over the following fact sheets you were given:   [facility] - Preparing for Surgery Before surgery, you can play an important role.  Because skin is not sterile, your skin needs to be as free of germs as possible.  You can reduce the number of germs on your skin by washing with CHG (chlorahexidine gluconate) soap before surgery.  CHG is an antiseptic cleaner which kills germs and bonds with the skin to continue killing germs even after washing. Please DO NOT use if you have an allergy to CHG or antibacterial soaps.  If your skin becomes reddened/irritated stop using the CHG and inform your nurse when you arrive at Short Stay. Do not shave (including legs and underarms) for at least 48 hours prior to the first CHG shower.  You may  shave your face/neck.  Please follow these instructions carefully:  1.  Shower with CHG Soap the night before surgery and the  morning of surgery.  2.  If you choose to wash your hair, wash your hair first as usual with your normal  shampoo.  3.  After you shampoo, rinse your hair and body thoroughly to remove the shampoo.                             4.  Use CHG as you would any other liquid soap.  You can apply chg directly to the skin and wash.  Gently with a scrungie or clean washcloth.  5.  Apply the CHG Soap to your body ONLY FROM THE NECK DOWN.   Do   not use on face/ open  Wound or open sores. Avoid contact with eyes, ears mouth and   genitals (private parts).                       Wash face,  Genitals (private parts) with your normal soap.             6.  Wash thoroughly, paying special attention to the area where your    surgery  will be performed.  7.  Thoroughly rinse your body with warm water from the neck down.  8.  DO NOT shower/wash with your normal soap after using and rinsing off the CHG Soap.                9.  Pat yourself dry with a clean towel.            10.  Wear clean pajamas.            11.  Place clean sheets on your bed the night of your first shower and do not  sleep with pets. Day of Surgery : Do not apply any lotions/deodorants the morning of surgery.  Please wear clean clothes to the hospital/surgery center.  FAILURE TO FOLLOW THESE INSTRUCTIONS MAY RESULT IN THE CANCELLATION OF YOUR SURGERY  PATIENT SIGNATURE_________________________________  NURSE SIGNATURE__________________________________  ________________________________________________________________________   Miguel Morris  An incentive spirometer is a tool that can help keep your lungs clear and active. This tool measures how well you are filling your lungs with each breath. Taking long deep breaths may help reverse or decrease the chance of developing breathing  (pulmonary) problems (especially infection) following:  A long period of time when you are unable to move or be active. BEFORE THE PROCEDURE   If the spirometer includes an indicator to show your best effort, your nurse or respiratory therapist will set it to a desired goal.  If possible, sit up straight or lean slightly forward. Try not to slouch.  Hold the incentive spirometer in an upright position. INSTRUCTIONS FOR USE  1. Sit on the edge of your bed if possible, or sit up as far as you can in bed or on a chair. 2. Hold the incentive spirometer in an upright position. 3. Breathe out normally. 4. Place the mouthpiece in your mouth and seal your lips tightly around it. 5. Breathe in slowly and as deeply as possible, raising the piston or the ball toward the top of the column. 6. Hold your breath for 3-5 seconds or for as long as possible. Allow the piston or ball to fall to the bottom of the column. 7. Remove the mouthpiece from your mouth and breathe out normally. 8. Rest for a few seconds and repeat Steps 1 through 7 at least 10 times every 1-2 hours when you are awake. Take your time and take a few normal breaths between deep breaths. 9. The spirometer may include an indicator to show your best effort. Use the indicator as a goal to work toward during each repetition. 10. After each set of 10 deep breaths, practice coughing to be sure your lungs are clear. If you have an incision (the cut made at the time of surgery), support your incision when coughing by placing a pillow or rolled up towels firmly against it. Once you are able to get out of bed, walk around indoors and cough well. You may stop using the incentive spirometer when instructed by your caregiver.  RISKS AND COMPLICATIONS  Take your time  so you do not get dizzy or light-headed.  If you are in pain, you may need to take or ask for pain medication before doing incentive spirometry. It is harder to take a deep breath if you  are having pain. AFTER USE  Rest and breathe slowly and easily.  It can be helpful to keep track of a log of your progress. Your caregiver can provide you with a simple table to help with this. If you are using the spirometer at home, follow these instructions: [name] IF:   You are having difficultly using the spirometer.  You have trouble using the spirometer as often as instructed.  Your pain medication is not giving enough relief while using the spirometer.  You develop fever of 100.5 F (38.1 C) or higher. SEEK IMMEDIATE MEDICAL CARE IF:   You cough up bloody sputum that had not been present before.  You develop fever of 102 F (38.9 C) or greater.  You develop worsening pain at or near the incision site. MAKE SURE YOU:   Understand these instructions.  Will watch your condition.  Will get help right away if you are not doing well or get worse. Document Released: 08/16/2006 Document Revised: 06/28/2011 Document Reviewed: 10/17/2006 ExitCare Patient Information 2014 ExitCare, [place].   ________________________________________________________________________  WHAT IS A BLOOD TRANSFUSION? Blood Transfusion Information  A transfusion is the replacement of blood or some of its parts. Blood is made up of multiple cells which provide different functions.  Red blood cells carry oxygen and are used for blood loss replacement.  White blood cells fight against infection.  Platelets control bleeding.  Plasma helps clot blood.  Other blood products are available for specialized needs, such as hemophilia or other clotting disorders. BEFORE THE TRANSFUSION  Who gives blood for transfusions?   Healthy volunteers who are fully evaluated to make sure their blood is safe. This is blood bank blood. Transfusion therapy is the safest it has ever been in the practice of medicine. Before blood is taken from a donor, a complete history is taken to make sure that person has  no history of diseases nor engages in risky social behavior (examples are intravenous drug use or sexual activity with multiple partners). The donor's travel history is screened to minimize risk of transmitting infections, such as malaria. The donated blood is tested for signs of infectious diseases, such as HIV and hepatitis. The blood is then tested to be sure it is compatible with you in order to minimize the chance of a transfusion reaction. If you or a relative donates blood, this is often done in anticipation of surgery and is not appropriate for emergency situations. It takes many days to process the donated blood. RISKS AND COMPLICATIONS Although transfusion therapy is very safe and saves many lives, the main dangers of transfusion include:   Getting an infectious disease.  Developing a transfusion reaction. This is an allergic reaction to something in the blood you were given. Every precaution is taken to prevent this. The decision to have a blood transfusion has been considered carefully by your caregiver before blood is given. Blood is not given unless the benefits outweigh the risks. AFTER THE TRANSFUSION  Right after receiving a blood transfusion, you will usually feel much better and more energetic. This is especially true if your red blood cells have gotten low (anemic). The transfusion raises the level of the red blood cells which carry oxygen, and this usually causes an energy increase.  The  nurse administering the transfusion will monitor you carefully for complications. HOME CARE INSTRUCTIONS  No special instructions are needed after a transfusion. You may find your energy is better. Speak with your caregiver about any limitations on activity for underlying diseases you may have. SEEK MEDICAL CARE IF:   Your condition is not improving after your transfusion.  You develop redness or irritation at the intravenous (IV) site. SEEK IMMEDIATE MEDICAL CARE IF:  Any of the following  symptoms occur over the next 12 hours:  Shaking chills.  You have a temperature by mouth above 102 F (38.9 C), not controlled by medicine.  Chest, back, or muscle pain.  People around you feel you are not acting correctly or are confused.  Shortness of breath or difficulty breathing.  Dizziness and fainting.  You get a rash or develop hives.  You have a decrease in urine output.  Your urine turns a dark color or changes to pink, red, or brown. Any of the following symptoms occur over the next 10 days:  You have a temperature by mouth above 102 F (38.9 C), not controlled by medicine.  Shortness of breath.  Weakness after normal activity.  The white part of the eye turns yellow (jaundice).  You have a decrease in the amount of urine or are urinating less often.  Your urine turns a dark color or changes to pink, red, or brown. Document Released: 04/02/2000 Document Revised: 06/28/2011 Document Reviewed: 11/20/2007 [facility] Patient Information 2014 [place], [place].  _______________________________________________________________________

## 2017-04-22 NOTE — Pre-Procedure Instructions (Signed)
The following are in epic: Last office visit with Dr, Acie Fredrickson 03/04/17 EKG 03/04/17 ECHO 06/08/16

## 2017-04-25 DIAGNOSIS — R413 Other amnesia: Secondary | ICD-10-CM | POA: Diagnosis not present

## 2017-04-26 ENCOUNTER — Encounter (HOSPITAL_COMMUNITY)
Admission: RE | Admit: 2017-04-26 | Discharge: 2017-04-26 | Disposition: A | Discharge status: Home / Self Care | Payer: Medicare Other | Source: Ambulatory Visit | Attending: Urology | Admitting: Urology

## 2017-04-26 ENCOUNTER — Encounter (HOSPITAL_COMMUNITY): Payer: Self-pay

## 2017-04-26 ENCOUNTER — Other Ambulatory Visit: Payer: Self-pay

## 2017-04-26 DIAGNOSIS — C61 Malignant neoplasm of prostate: Secondary | ICD-10-CM | POA: Insufficient documentation

## 2017-04-26 DIAGNOSIS — Z01812 Encounter for preprocedural laboratory examination: Secondary | ICD-10-CM | POA: Insufficient documentation

## 2017-04-26 HISTORY — DX: Fracture of unspecified part of unspecified clavicle, initial encounter for closed fracture: S42.009A

## 2017-04-26 HISTORY — DX: Other amnesia: R41.3

## 2017-04-26 HISTORY — DX: Adverse effect of unspecified anesthetic, initial encounter: T41.45XA

## 2017-04-26 HISTORY — DX: Restless legs syndrome: G25.81

## 2017-04-26 HISTORY — DX: Unspecified hearing loss, unspecified ear: H91.90

## 2017-04-26 HISTORY — DX: Anemia, unspecified: D64.9

## 2017-04-26 HISTORY — DX: Personal history of other (healed) physical injury and trauma: Z87.828

## 2017-04-26 HISTORY — DX: Other complications of anesthesia, initial encounter: T88.59XA

## 2017-04-26 LAB — CBC
HCT: 40.1 % (ref 39.0–52.0)
Hemoglobin: 13.2 g/dL (ref 13.0–17.0)
MCH: 27 pg (ref 26.0–34.0)
MCHC: 32.9 g/dL (ref 30.0–36.0)
MCV: 82.2 fL (ref 78.0–100.0)
PLATELETS: 257 10*3/uL (ref 150–400)
RBC: 4.88 MIL/uL (ref 4.22–5.81)
RDW: 15.6 % — AB (ref 11.5–15.5)
WBC: 4.7 10*3/uL (ref 4.0–10.5)

## 2017-04-26 LAB — HEMOGLOBIN A1C
Hgb A1c MFr Bld: 5.9 % — ABNORMAL HIGH (ref 4.8–5.6)
Mean Plasma Glucose: 122.63 mg/dL

## 2017-04-26 LAB — BASIC METABOLIC PANEL
Anion gap: 6 (ref 5–15)
BUN: 19 mg/dL (ref 6–20)
CO2: 29 mmol/L (ref 22–32)
CREATININE: 0.9 mg/dL (ref 0.61–1.24)
Calcium: 9.1 mg/dL (ref 8.9–10.3)
Chloride: 104 mmol/L (ref 101–111)
GFR calc Af Amer: >60 mL/min (ref >60)
GLUCOSE: 102 mg/dL — AB (ref 65–99)
Potassium: 3.9 mmol/L (ref 3.5–5.1)
SODIUM: 139 mmol/L (ref 135–145)

## 2017-04-26 MED ORDER — MAGNESIUM CITRATE PO SOLN
1.0000 | Freq: Once | ORAL | Status: DC
  Filled 2017-04-26: qty 296

## 2017-04-26 MED ORDER — FLEET ENEMA 7-19 GM/118ML RE ENEM
1.0000 | ENEMA | Freq: Once | RECTAL | Status: DC
  Filled 2017-04-26: qty 1

## 2017-04-26 NOTE — Pre-Procedure Instructions (Signed)
CBC, BMP, Hgb A1C faxed to Dr. Alinda Money via epic.

## 2017-04-29 NOTE — H&P (Signed)
Office Visit Report     04/22/2017   --------------------------------------------------------------------------------   Miguel Morris  MRN: [id number]  PRIMARY CARE:  Priscille Heidelberg. Little, MD  DOB: 1950/05/20, 67 year old Male  REFERRING:  Raynelle Bring, MD  SSN: [id number]  PROVIDER:  Carolan Clines, M.D.    TREATING:  Raynelle Bring, M.D.    LOCATION:  [facility] [phone]   --------------------------------------------------------------------------------   CC/HPI: Prostate cancer    He was diagnosed with prostate cancer by Dr. Gaynelle Arabian in 2014. His PSA at diagnosis was 4.31 and a biopsy on 03/30/13 demonstrated 1 out of 12 biopsy cores positive for Gleason 3+4=7, clinical stage T1c adenocarcinoma. He was 63 at the time but chose not to proceed with definitive therapy and, in fact, did not follow up as recommended. He eventually returned to Dr. Gaynelle Arabian in 2016 and has been undergoing active surveillance. A subsequent biopsy in October 2017 revealed Gleason 6 disease. Recently, he presented to establish care with me and was found to have a rising PSA that is now 13.0. We discussed that his PSA trend is concerning and reviewed his initial biopsy results indicating Gleason 7 disease. I did recommend that he proceed with treatment. After considering his options, he has elected to proceed with surgical therapy. He follows up today to further discuss treatment options.   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   Initial diagnosis: December 2014  PSA at diagnosis: 4.31  Gleason score: 3+4=7  Biopsy (03/30/13): 1/12 cores positive  Left: L apex (10%, 3+4=7)  Right: Benign  Prostate volume: 54.8  PSAD: 0.08   Surveillance   May 2016: 12 core biopsy - benign, Vol 63 cc  Oct 2017: MRI - PI-RADS 4 lesion measuring 8 mm at medial left base  Dec 2017: MR/US fusion biopsy - 18 biopsies - 1/18 cores positive, Vol 80.9 cc  Left: L apex (10%.  3+3=6)  Right: Benign  MRI ROI: Benign     ALLERGIES: No Allergies    MEDICATIONS: Fish Oil CAPS Oral  Prozac 10 mg tablet Oral  Tums 200 mg calcium (500 mg) tablet, chewable Oral  Vitamin C 500 mg tablet Oral     GU PSH: Prostate Needle Biopsy - 04/01/2016    NON-GU PSH: Bmi<30 And >=22 Calc & Docu - 04/01/2017 Dental Surgery Procedure - 2014 Diagnostic Colonoscopy - 2014 Doc Meds Verified W/Pt Or Re - 04/01/2017 Hip Replacement, Right Other Pt/Ot Current Status - 04/01/2017 Other Pt/Ot Goal Status - 04/01/2017 Pain Neg No Plan - 04/01/2017 Remove Tonsils - 2014 Revise Knee Joint - 2016 Surgical Pathology, Gross And Microscopic Examination For Prostate Needle - 04/01/2016 Treat Clavicle Fracture - 2014    GU PMH: Urinary Frequency - 04/01/2017 Prostate Cancer - 02/09/2016, Prostate cancer, - 2016 BPH w/LUTS, Benign localized hyperplasia of prostate with urinary obstruction - 2016 Elevated PSA, Elevated prostate specific antigen (PSA) - 2016      PMH Notes:   1) Prostate cancer: He was diagnosed with prostate cancer by Dr. Gaynelle Arabian in 2014. His PSA at diagnosis was 4.31 and a biopsy on 03/30/13 demonstrated 1 out of 12 biopsy cores positive for Gleason 3+4=7, clinical stage T1c adenocarcinoma. He was 63 at the time but chose not to proceed with definitive therapy and, in fact, did not follow up as recommended. He eventually returned to Dr. Gaynelle Arabian in 2016 and has been undergoing active surveillance.   ** He has a history of recurrent  DVTs after orthopedic surgery.   Initial diagnosis: December 2014  PSA at diagnosis: 4.31  Gleason score: 3+4=7  Biopsy (03/30/13): 1/12 cores positive  Left: L apex (10%, 3+4=7)  Right: Benign  Prostate volume: 54.8  PSAD: 0.08   Surveillance   May 2016: 12 core biopsy - benign, Vol 63 cc  Oct 2017: MRI - PI-RADS 4 lesion measuring 8 mm at medial left base  Dec 2017: MR/US fusion biopsy - 18 biopsies - 1/18 cores positive,  Vol 80.9 cc  Left: L apex (10%. 3+3=6)  Right: Benign  MRI ROI: Benign     NON-GU PMH: Muscle weakness (generalized) - 04/01/2017 Other specified disorders of muscle - 04/01/2017 Arthritis Gout Hypertension    FAMILY HISTORY: No Family History    SOCIAL HISTORY: Marital Status: Married Preferred Language: English; Ethnicity: Not Hispanic Or Latino; Race: White Current Smoking Status: Patient does not smoke anymore.  Does drink.     REVIEW OF SYSTEMS:    GU Review Male:   Patient reports frequent urination, hard to postpone urination, and get up at night to urinate. Patient denies burning/ pain with urination, leakage of urine, stream starts and stops, trouble starting your streams, and have to strain to urinate .  Gastrointestinal (Upper):   Patient denies nausea and vomiting.  Gastrointestinal (Lower):   Patient denies diarrhea and constipation.  Constitutional:   Patient denies fever, night sweats, weight loss, and fatigue.  Skin:   Patient denies skin rash/ lesion and itching.  Eyes:   Patient denies blurred vision and double vision.  Ears/ Nose/ Throat:   Patient denies sore throat and sinus problems.  Hematologic/Lymphatic:   Patient denies swollen glands and easy bruising.  Cardiovascular:   Patient denies leg swelling and chest pains.  Respiratory:   Patient denies cough and shortness of breath.  Endocrine:   Patient denies excessive thirst.  Musculoskeletal:   Patient denies back pain and joint pain.  Neurological:   Patient denies headaches and dizziness.  Psychologic:   Patient denies depression and anxiety.   VITAL SIGNS:      04/22/2017 03:24 PM  BP 120/70 mmHg  Pulse 64 /min  Temperature 98.3 F / 36.8 C   GU PHYSICAL EXAMINATION:    Prostate: Prostate about 50 grams. Left lobe normal consistency, right lobe normal consistency. Symmetrical lobes. No prostate nodule. Left lobe no tenderness, right lobe no tenderness.    MULTI-SYSTEM PHYSICAL EXAMINATION:     Constitutional: Well-nourished. No physical deformities. Normally developed. Good grooming.  Neck: Neck symmetrical, not swollen. Normal tracheal position.  Respiratory: No labored breathing, no use of accessory muscles. Clear bilaterally.  Cardiovascular: Normal temperature, normal extremity pulses, no swelling, no varicosities. Regular rate and rhythm.  Lymphatic: No enlargement of neck, axillae, groin.  Skin: No paleness, no jaundice, no cyanosis. No lesion, no ulcer, no rash.  Neurologic / Psychiatric: Oriented to time, oriented to place, oriented to person. No depression, no anxiety, no agitation.  Gastrointestinal: No mass, no tenderness, no rigidity, non obese abdomen.   Eyes: Normal conjunctivae. Normal eyelids.  Ears, Nose, Mouth, and Throat: Left ear no scars, no lesions, no masses. Right ear no scars, no lesions, no masses. Nose no scars, no lesions, no masses. Normal hearing. Normal lips.  Musculoskeletal: Normal gait and station of head and neck.     PAST DATA REVIEWED:  Source Of History:  Patient  Lab Test Review:   PSA  Records Review:   Pathology Reports, Previous Patient Records  Urine Test Review:   Urinalysis   03/14/17 07/20/16 02/02/16 03/01/13  PSA  Total PSA 13.00 ng/mL 10.60 ng/dl 9.88  5.65   Free PSA    0.66   % Free PSA    12     PROCEDURES:          Urinalysis - 81003 Dipstick Dipstick Cont'd  Color: Yellow Bilirubin: Neg  Appearance: Clear Ketones: Neg  Specific Gravity: 1.020 Blood: Neg  pH: 6.0 Protein: Neg  Glucose: Neg Urobilinogen: 0.2    Nitrites: Neg    Leukocyte Esterase: Neg    Notes:      ASSESSMENT:      ICD-10 Details  1 GU:   Prostate Cancer - C61    PLAN:           Schedule Return Visit/Planned Activity: Keep Scheduled Appointment - Office Visit          Document Letter(s):  Created for Patient: Clinical Summary         Notes:   1. Prostate cancer: Mr. Seats reiterated his decision to proceed with surgical therapy.  We discussed surgical therapy for prostate cancer including the different available surgical approaches. We discussed, in detail, the risks and expectations of surgery with regard to cancer control, urinary control, and erectile function as well as the expected postoperative recovery process. Additional risks of surgery including but not limited to bleeding, infection, hernia formation, nerve damage, lymphocele formation, bowel/rectal injury potentially necessitating colostomy, damage to the urinary tract resulting in urine leakage, urethral stricture, and the cardiopulmonary risks such as myocardial infarction, stroke, death, venothromboembolism, etc. were explained. The risk of open surgical conversion for robotic/laparoscopic prostatectomy was also discussed.   I will plan to administer Lovenox prophylactically considering his history of recurrent DVT. He will proceed with a bilateral nerve sparing robot-assisted laparoscopic radical prostatectomy and bilateral pelvic lymphadenectomy.   Cc: Dr. Hulan Fess      E & M CODE: I spent at least 32 minutes face to face with the patient, more than 50% of that time was spent on counseling and/or coordinating care.     * Signed by Raynelle Bring, M.D. on 04/26/17 at 4:58 PM (EST)*

## 2017-05-02 ENCOUNTER — Ambulatory Visit (HOSPITAL_COMMUNITY): Payer: Medicare Other | Admitting: Anesthesiology

## 2017-05-02 ENCOUNTER — Encounter (HOSPITAL_COMMUNITY): Payer: Self-pay | Admitting: *Deleted

## 2017-05-02 ENCOUNTER — Observation Stay (HOSPITAL_COMMUNITY)
Admission: RE | Admit: 2017-05-02 | Discharge: 2017-05-03 | Disposition: A | Discharge status: Home / Self Care | Payer: Medicare Other | Source: Ambulatory Visit | Attending: Urology | Admitting: Urology

## 2017-05-02 ENCOUNTER — Other Ambulatory Visit: Payer: Self-pay

## 2017-05-02 ENCOUNTER — Encounter (HOSPITAL_COMMUNITY)
Admission: RE | Disposition: A | Discharge status: Home / Self Care | Payer: Self-pay | Source: Ambulatory Visit | Attending: Urology

## 2017-05-02 DIAGNOSIS — I517 Cardiomegaly: Secondary | ICD-10-CM | POA: Diagnosis not present

## 2017-05-02 DIAGNOSIS — Z87891 Personal history of nicotine dependence: Secondary | ICD-10-CM | POA: Insufficient documentation

## 2017-05-02 DIAGNOSIS — D649 Anemia, unspecified: Secondary | ICD-10-CM | POA: Insufficient documentation

## 2017-05-02 DIAGNOSIS — C61 Malignant neoplasm of prostate: Secondary | ICD-10-CM | POA: Diagnosis not present

## 2017-05-02 DIAGNOSIS — K219 Gastro-esophageal reflux disease without esophagitis: Secondary | ICD-10-CM | POA: Insufficient documentation

## 2017-05-02 DIAGNOSIS — I10 Essential (primary) hypertension: Secondary | ICD-10-CM | POA: Diagnosis not present

## 2017-05-02 DIAGNOSIS — Z79899 Other long term (current) drug therapy: Secondary | ICD-10-CM | POA: Insufficient documentation

## 2017-05-02 HISTORY — PX: LYMPHADENECTOMY: SHX5960

## 2017-05-02 HISTORY — PX: ROBOT ASSISTED LAPAROSCOPIC RADICAL PROSTATECTOMY: SHX5141

## 2017-05-02 LAB — HEMOGLOBIN AND HEMATOCRIT, BLOOD
HCT: 35.9 % — ABNORMAL LOW (ref 39.0–52.0)
HEMOGLOBIN: 11.8 g/dL — AB (ref 13.0–17.0)

## 2017-05-02 LAB — TYPE AND SCREEN
ABO/RH(D): O POS
ANTIBODY SCREEN: NEGATIVE

## 2017-05-02 SURGERY — XI ROBOTIC ASSISTED LAPAROSCOPIC RADICAL PROSTATECTOMY LEVEL 2
Anesthesia: General

## 2017-05-02 MED ORDER — HEPARIN SODIUM (PORCINE) 1000 UNIT/ML IJ SOLN
INTRAMUSCULAR | Status: AC
Start: 2017-05-02 — End: ?
  Filled 2017-05-02: qty 1

## 2017-05-02 MED ORDER — DIPHENHYDRAMINE HCL 50 MG/ML IJ SOLN: 12.5000 mg | Freq: Four times a day (QID) | INTRAMUSCULAR | Status: DC | PRN

## 2017-05-02 MED ORDER — SUGAMMADEX SODIUM 200 MG/2ML IV SOLN
INTRAVENOUS | Status: AC
  Filled 2017-05-02: qty 2

## 2017-05-02 MED ORDER — LACTATED RINGERS IV SOLN
INTRAVENOUS | Status: DC | PRN
  Administered 2017-05-02: 1000 mL

## 2017-05-02 MED ORDER — SUFENTANIL CITRATE 50 MCG/ML IV SOLN
INTRAVENOUS | Status: DC | PRN
  Administered 2017-05-02: 10 ug via INTRAVENOUS
  Administered 2017-05-02: 20 ug via INTRAVENOUS
  Administered 2017-05-02 (×2): 10 ug via INTRAVENOUS

## 2017-05-02 MED ORDER — SODIUM CHLORIDE 0.9 % IR SOLN
Status: DC | PRN
  Administered 2017-05-02: 1000 mL via INTRAVESICAL

## 2017-05-02 MED ORDER — EPHEDRINE 5 MG/ML INJ
INTRAVENOUS | Status: AC
Start: 2017-05-02 — End: ?
  Filled 2017-05-02: qty 10

## 2017-05-02 MED ORDER — EPHEDRINE SULFATE-NACL 50-0.9 MG/10ML-% IV SOSY
PREFILLED_SYRINGE | INTRAVENOUS | Status: DC | PRN
  Administered 2017-05-02: 5 mg via INTRAVENOUS
  Administered 2017-05-02 (×3): 10 mg via INTRAVENOUS

## 2017-05-02 MED ORDER — ROCURONIUM BROMIDE 50 MG/5ML IV SOSY
PREFILLED_SYRINGE | INTRAVENOUS | Status: AC
  Filled 2017-05-02: qty 5

## 2017-05-02 MED ORDER — HEPARIN SODIUM (PORCINE) 5000 UNIT/ML IJ SOLN
INTRAMUSCULAR | Status: DC | PRN
  Administered 2017-05-02: 5000 [IU] via SUBCUTANEOUS

## 2017-05-02 MED ORDER — ACETAMINOPHEN 325 MG PO TABS: 650.0000 mg | ORAL_TABLET | ORAL | Status: DC | PRN

## 2017-05-02 MED ORDER — SUFENTANIL CITRATE 50 MCG/ML IV SOLN
INTRAVENOUS | Status: AC
  Filled 2017-05-02: qty 1

## 2017-05-02 MED ORDER — AMLODIPINE BESYLATE 10 MG PO TABS
10.0000 mg | ORAL_TABLET | Freq: Every day | ORAL | Status: DC
  Administered 2017-05-03: 10 mg via ORAL
  Filled 2017-05-02: qty 1

## 2017-05-02 MED ORDER — LIDOCAINE 2% (20 MG/ML) 5 ML SYRINGE
INTRAMUSCULAR | Status: DC | PRN
  Administered 2017-05-02: 100 mg via INTRAVENOUS

## 2017-05-02 MED ORDER — HEPARIN SODIUM (PORCINE) 5000 UNIT/ML IJ SOLN
INTRAMUSCULAR | Status: AC
  Filled 2017-05-02: qty 1

## 2017-05-02 MED ORDER — DIPHENHYDRAMINE HCL 12.5 MG/5ML PO ELIX: 12.5000 mg | ORAL_SOLUTION | Freq: Four times a day (QID) | ORAL | Status: DC | PRN

## 2017-05-02 MED ORDER — HEPARIN SODIUM (PORCINE) 5000 UNIT/ML IJ SOLN
5000.0000 [IU] | Freq: Three times a day (TID) | INTRAMUSCULAR | Status: DC
  Administered 2017-05-02 – 2017-05-03 (×2): 5000 [IU] via SUBCUTANEOUS
  Filled 2017-05-02 (×2): qty 1

## 2017-05-02 MED ORDER — LIDOCAINE 2% (20 MG/ML) 5 ML SYRINGE
INTRAMUSCULAR | Status: AC
  Filled 2017-05-02: qty 5

## 2017-05-02 MED ORDER — DOCUSATE SODIUM 100 MG PO CAPS
100.0000 mg | ORAL_CAPSULE | Freq: Two times a day (BID) | ORAL | Status: DC
  Administered 2017-05-02 – 2017-05-03 (×2): 100 mg via ORAL
  Filled 2017-05-02 (×2): qty 1

## 2017-05-02 MED ORDER — HYDROMORPHONE HCL 1 MG/ML IJ SOLN
INTRAMUSCULAR | Status: DC | PRN
  Administered 2017-05-02 (×5): .4 mg via INTRAVENOUS

## 2017-05-02 MED ORDER — SCOPOLAMINE 1 MG/3DAYS TD PT72
1.0000 | MEDICATED_PATCH | TRANSDERMAL | Status: DC
  Administered 2017-05-02: 1.5 mg via TRANSDERMAL

## 2017-05-02 MED ORDER — MIDAZOLAM HCL 2 MG/2ML IJ SOLN
INTRAMUSCULAR | Status: DC | PRN
  Administered 2017-05-02: 2 mg via INTRAVENOUS

## 2017-05-02 MED ORDER — DEXAMETHASONE SODIUM PHOSPHATE 10 MG/ML IJ SOLN
INTRAMUSCULAR | Status: DC | PRN
  Administered 2017-05-02: 10 mg via INTRAVENOUS

## 2017-05-02 MED ORDER — HYDROCHLOROTHIAZIDE 25 MG PO TABS
25.0000 mg | ORAL_TABLET | Freq: Every day | ORAL | Status: DC
  Administered 2017-05-03: 25 mg via ORAL
  Filled 2017-05-02: qty 1

## 2017-05-02 MED ORDER — HYDROMORPHONE HCL 2 MG/ML IJ SOLN
INTRAMUSCULAR | Status: AC
  Filled 2017-05-02: qty 1

## 2017-05-02 MED ORDER — BUPIVACAINE HCL (PF) 0.25 % IJ SOLN
INTRAMUSCULAR | Status: AC
Start: 2017-05-02 — End: ?
  Filled 2017-05-02: qty 30

## 2017-05-02 MED ORDER — MIDAZOLAM HCL 2 MG/2ML IJ SOLN
INTRAMUSCULAR | Status: AC
  Filled 2017-05-02: qty 2

## 2017-05-02 MED ORDER — PRAMIPEXOLE DIHYDROCHLORIDE 0.25 MG PO TABS
0.5000 mg | ORAL_TABLET | Freq: Every day | ORAL | Status: DC
  Administered 2017-05-02: 0.5 mg via ORAL
  Filled 2017-05-02: qty 2

## 2017-05-02 MED ORDER — PROPOFOL 10 MG/ML IV BOLUS
INTRAVENOUS | Status: DC | PRN
  Administered 2017-05-02: 180 mg via INTRAVENOUS

## 2017-05-02 MED ORDER — KCL IN DEXTROSE-NACL 20-5-0.45 MEQ/L-%-% IV SOLN
INTRAVENOUS | Status: DC
  Administered 2017-05-02 – 2017-05-03 (×2): via INTRAVENOUS
  Filled 2017-05-02 (×3): qty 1000

## 2017-05-02 MED ORDER — CEFAZOLIN SODIUM-DEXTROSE 1-4 GM/50ML-% IV SOLN
1.0000 g | Freq: Three times a day (TID) | INTRAVENOUS | Status: AC
  Administered 2017-05-02 – 2017-05-03 (×2): 1 g via INTRAVENOUS
  Filled 2017-05-02 (×2): qty 50

## 2017-05-02 MED ORDER — FENTANYL CITRATE (PF) 100 MCG/2ML IJ SOLN: 25.0000 ug | INTRAMUSCULAR | Status: DC | PRN

## 2017-05-02 MED ORDER — SUGAMMADEX SODIUM 200 MG/2ML IV SOLN
INTRAVENOUS | Status: DC | PRN
  Administered 2017-05-02: 200 mg via INTRAVENOUS

## 2017-05-02 MED ORDER — SUCCINYLCHOLINE CHLORIDE 200 MG/10ML IV SOSY
PREFILLED_SYRINGE | INTRAVENOUS | Status: AC
  Filled 2017-05-02: qty 10

## 2017-05-02 MED ORDER — HYDROCODONE-ACETAMINOPHEN 5-325 MG PO TABS: 1.0000 | ORAL_TABLET | Freq: Four times a day (QID) | ORAL | 0 refills | Status: DC | PRN

## 2017-05-02 MED ORDER — CEFAZOLIN SODIUM-DEXTROSE 2-4 GM/100ML-% IV SOLN
2.0000 g | Freq: Once | INTRAVENOUS | Status: AC
  Administered 2017-05-02: 2 g via INTRAVENOUS
  Filled 2017-05-02: qty 100

## 2017-05-02 MED ORDER — BUPIVACAINE HCL (PF) 0.25 % IJ SOLN
INTRAMUSCULAR | Status: DC | PRN
  Administered 2017-05-02: 30 mL

## 2017-05-02 MED ORDER — SCOPOLAMINE 1 MG/3DAYS TD PT72
MEDICATED_PATCH | TRANSDERMAL | Status: AC
Start: 2017-05-02 — End: 2017-05-02
  Filled 2017-05-02: qty 1

## 2017-05-02 MED ORDER — ONDANSETRON HCL 4 MG/2ML IJ SOLN
INTRAMUSCULAR | Status: DC | PRN
  Administered 2017-05-02: 4 mg via INTRAVENOUS

## 2017-05-02 MED ORDER — ONDANSETRON HCL 4 MG/2ML IJ SOLN
INTRAMUSCULAR | Status: AC
  Filled 2017-05-02: qty 2

## 2017-05-02 MED ORDER — SULFAMETHOXAZOLE-TRIMETHOPRIM 800-160 MG PO TABS: 1.0000 | ORAL_TABLET | Freq: Two times a day (BID) | ORAL | 0 refills | Status: DC

## 2017-05-02 MED ORDER — MORPHINE SULFATE (PF) 4 MG/ML IV SOLN: 2.0000 mg | INTRAVENOUS | Status: DC | PRN

## 2017-05-02 MED ORDER — SODIUM CHLORIDE 0.9 % IJ SOLN
INTRAMUSCULAR | Status: AC
  Filled 2017-05-02: qty 10

## 2017-05-02 MED ORDER — KETOROLAC TROMETHAMINE 15 MG/ML IJ SOLN
15.0000 mg | Freq: Four times a day (QID) | INTRAMUSCULAR | Status: DC
  Administered 2017-05-02 – 2017-05-03 (×4): 15 mg via INTRAVENOUS
  Filled 2017-05-02 (×4): qty 1

## 2017-05-02 MED ORDER — PROPOFOL 10 MG/ML IV BOLUS
INTRAVENOUS | Status: AC
  Filled 2017-05-02: qty 20

## 2017-05-02 MED ORDER — LACTATED RINGERS IV SOLN
INTRAVENOUS | Status: DC
  Administered 2017-05-02 (×3): via INTRAVENOUS

## 2017-05-02 MED ORDER — SODIUM CHLORIDE 0.9 % IV BOLUS (SEPSIS)
1000.0000 mL | Freq: Once | INTRAVENOUS | Status: AC
  Administered 2017-05-02: 1000 mL via INTRAVENOUS

## 2017-05-02 MED ORDER — DEXAMETHASONE SODIUM PHOSPHATE 10 MG/ML IJ SOLN
INTRAMUSCULAR | Status: AC
  Filled 2017-05-02: qty 1

## 2017-05-02 MED ORDER — ROCURONIUM BROMIDE 10 MG/ML (PF) SYRINGE
PREFILLED_SYRINGE | INTRAVENOUS | Status: DC | PRN
  Administered 2017-05-02: 10 mg via INTRAVENOUS
  Administered 2017-05-02: 20 mg via INTRAVENOUS
  Administered 2017-05-02: 10 mg via INTRAVENOUS
  Administered 2017-05-02: 20 mg via INTRAVENOUS
  Administered 2017-05-02: 50 mg via INTRAVENOUS
  Administered 2017-05-02: 10 mg via INTRAVENOUS

## 2017-05-02 SURGICAL SUPPLY — 59 items
ADH SKN CLS APL DERMABOND .7 (GAUZE/BANDAGES/DRESSINGS) ×2
APPLICATOR COTTON TIP 6IN STRL (MISCELLANEOUS) ×4 IMPLANT
CATH FOLEY 2WAY SLVR 18FR 30CC (CATHETERS) ×4 IMPLANT
CATH ROBINSON RED A/P 16FR (CATHETERS) ×4 IMPLANT
CATH ROBINSON RED A/P 8FR (CATHETERS) ×4 IMPLANT
CATH TIEMANN FOLEY 18FR 5CC (CATHETERS) ×4 IMPLANT
CHLORAPREP W/TINT 26ML (MISCELLANEOUS) ×4 IMPLANT
CLIP VESOLOCK LG 6/CT PURPLE (CLIP) ×8 IMPLANT
COVER SURGICAL LIGHT HANDLE (MISCELLANEOUS) ×4 IMPLANT
COVER TIP SHEARS 8 DVNC (MISCELLANEOUS) ×2 IMPLANT
COVER TIP SHEARS 8MM DA VINCI (MISCELLANEOUS) ×2
CUTTER ECHEON FLEX ENDO 45 340 (ENDOMECHANICALS) ×4 IMPLANT
DECANTER SPIKE VIAL GLASS SM (MISCELLANEOUS) ×4 IMPLANT
DERMABOND ADVANCED (GAUZE/BANDAGES/DRESSINGS) ×2
DERMABOND ADVANCED .7 DNX12 (GAUZE/BANDAGES/DRESSINGS) IMPLANT
DRAPE ARM DVNC X/XI (DISPOSABLE) ×8 IMPLANT
DRAPE COLUMN DVNC XI (DISPOSABLE) ×2 IMPLANT
DRAPE DA VINCI XI ARM (DISPOSABLE) ×8
DRAPE DA VINCI XI COLUMN (DISPOSABLE) ×2
DRAPE SURG IRRIG POUCH 19X23 (DRAPES) ×4 IMPLANT
DRSG TEGADERM 4X4.75 (GAUZE/BANDAGES/DRESSINGS) ×4 IMPLANT
ELECT PENCIL ROCKER SW 15FT (MISCELLANEOUS) ×2 IMPLANT
ELECT REM PT RETURN 15FT ADLT (MISCELLANEOUS) ×4 IMPLANT
GLOVE BIO SURGEON STRL SZ 6.5 (GLOVE) ×3 IMPLANT
GLOVE BIO SURGEONS STRL SZ 6.5 (GLOVE) ×1
GLOVE BIOGEL M STRL SZ7.5 (GLOVE) ×8 IMPLANT
GOWN STRL REUS W/TWL LRG LVL3 (GOWN DISPOSABLE) ×12 IMPLANT
HEMOSTAT SURGICEL 2X3 (HEMOSTASIS) ×2 IMPLANT
HOLDER FOLEY CATH W/STRAP (MISCELLANEOUS) ×4 IMPLANT
IRRIG SUCT STRYKERFLOW 2 WTIP (MISCELLANEOUS) ×4
IRRIGATION SUCT STRKRFLW 2 WTP (MISCELLANEOUS) ×2 IMPLANT
IV LACTATED RINGERS 1000ML (IV SOLUTION) ×4 IMPLANT
NDL SAFETY ECLIPSE 18X1.5 (NEEDLE) ×2 IMPLANT
NEEDLE HYPO 18GX1.5 SHARP (NEEDLE) ×4
PACK ROBOT UROLOGY CUSTOM (CUSTOM PROCEDURE TRAY) ×4 IMPLANT
RELOAD STAPLE 45 4.1 GRN THCK (STAPLE) ×2 IMPLANT
SEAL CANN UNIV 5-8 DVNC XI (MISCELLANEOUS) ×8 IMPLANT
SEAL XI 5MM-8MM UNIVERSAL (MISCELLANEOUS) ×8
SOLUTION ELECTROLUBE (MISCELLANEOUS) ×4 IMPLANT
STAPLE RELOAD 45 GRN (STAPLE) ×2 IMPLANT
STAPLE RELOAD 45MM GREEN (STAPLE) ×4
SUT ETHILON 3 0 PS 1 (SUTURE) ×4 IMPLANT
SUT MNCRL 3 0 RB1 (SUTURE) ×2 IMPLANT
SUT MNCRL 3 0 VIOLET RB1 (SUTURE) ×2 IMPLANT
SUT MNCRL AB 4-0 PS2 18 (SUTURE) ×8 IMPLANT
SUT MONOCRYL 3 0 RB1 (SUTURE) ×4
SUT VIC AB 0 CT1 27 (SUTURE) ×4
SUT VIC AB 0 CT1 27XBRD ANTBC (SUTURE) ×2 IMPLANT
SUT VIC AB 0 UR5 27 (SUTURE) ×4 IMPLANT
SUT VIC AB 2-0 SH 27 (SUTURE) ×4
SUT VIC AB 2-0 SH 27X BRD (SUTURE) ×2 IMPLANT
SUT VIC AB 3-0 SH 27 (SUTURE) ×8
SUT VIC AB 3-0 SH 27XBRD (SUTURE) IMPLANT
SUT VICRYL 0 UR6 27IN ABS (SUTURE) ×8 IMPLANT
SYR 27GX1/2 1ML LL SAFETY (SYRINGE) ×4 IMPLANT
TOWEL OR 17X26 10 PK STRL BLUE (TOWEL DISPOSABLE) ×2 IMPLANT
TOWEL OR NON WOVEN STRL DISP B (DISPOSABLE) ×4 IMPLANT
TUBING INSUFFLATION 10FT LAP (TUBING) ×2 IMPLANT
WATER STERILE IRR 1000ML POUR (IV SOLUTION) ×8 IMPLANT

## 2017-05-02 NOTE — Anesthesia Postprocedure Evaluation (Signed)
Anesthesia Post Note  Patient: Miguel Morris.  Procedure(s) Performed: XI ROBOTIC ASSISTED LAPAROSCOPIC RADICAL PROSTATECTOMY LEVEL 2 (N/A ) PELVIC/BILATERAL LYMPHADENECTOMY (Bilateral )     Patient location during evaluation: PACU Anesthesia Type: General Level of consciousness: awake Pain management: pain level controlled Vital Signs Assessment: post-procedure vital signs reviewed and stable Respiratory status: spontaneous breathing Cardiovascular status: stable Anesthetic complications: no    Last Vitals:  Vitals:   05/02/17 1600 05/02/17 1615  BP: 134/81 133/79  Pulse: 77 71  Resp: 15 17  Temp:    SpO2: 96% 96%    Last Pain:  Vitals:   05/02/17 1615  TempSrc:   PainSc: Asleep                 Shamekia Tippets

## 2017-05-02 NOTE — Interval H&P Note (Signed)
History and Physical Interval Note:  05/02/2017 10:42 AM  Miguel Morris.  has presented today for surgery, with the diagnosis of PROSTATE CANCER  The various methods of treatment have been discussed with the patient and family. After consideration of risks, benefits and other options for treatment, the patient has consented to  Procedure(s): XI ROBOTIC ASSISTED LAPAROSCOPIC RADICAL PROSTATECTOMY LEVEL 2 (N/A) PELVIC/BILATERAL LYMPHADENECTOMY (Bilateral) as a surgical intervention .  The patient's history has been reviewed, patient examined, no change in status, stable for surgery.  I have reviewed the patient's chart and labs.  Questions were answered to the patient's satisfaction.     Blayton Huttner,LES

## 2017-05-02 NOTE — Plan of Care (Signed)
  Progressing Education: Knowledge of General Education information will improve 05/02/2017 2345 - Progressing by Talbert Forest, RN Health Behavior/Discharge Planning: Ability to manage health-related needs will improve 05/02/2017 2345 - Progressing by Talbert Forest, RN Clinical Measurements: Ability to maintain clinical measurements within normal limits will improve 05/02/2017 2345 - Progressing by Talbert Forest, RN Will remain free from infection 05/02/2017 2345 - Progressing by Talbert Forest, RN Diagnostic test results will improve 05/02/2017 2345 - Progressing by Talbert Forest, RN Respiratory complications will improve 05/02/2017 2345 - Progressing by Talbert Forest, RN Cardiovascular complication will be avoided 05/02/2017 2345 - Progressing by Talbert Forest, RN Activity: Risk for activity intolerance will decrease 05/02/2017 2345 - Progressing by Talbert Forest, RN Nutrition: Adequate nutrition will be maintained 05/02/2017 2345 - Progressing by Talbert Forest, RN Coping: Level of anxiety will decrease 05/02/2017 2345 - Progressing by Talbert Forest, RN Elimination: Will not experience complications related to urinary retention 05/02/2017 2345 - Progressing by Talbert Forest, RN Pain Managment: General experience of comfort will improve 05/02/2017 2345 - Progressing by Talbert Forest, RN Safety: Ability to remain free from injury will improve 05/02/2017 2345 - Progressing by Talbert Forest, RN Skin Integrity: Risk for impaired skin integrity will decrease 05/02/2017 2345 - Progressing by Talbert Forest, RN

## 2017-05-02 NOTE — Progress Notes (Signed)
Patient ID: Miguel Nettles., male   DOB: Nov 12, 1950, 67 y.o.   MRN: [id number]  Post-op note  Subjective: The patient is doing well.  No complaints.  Objective: Vital signs in last 24 hours: Temp:  [98.3 F (36.8 C)-98.5 F (36.9 C)] 98.3 F (36.8 C) (01/14 1655) Pulse Rate:  [61-77] 61 (01/14 1655) Resp:  [15-18] 16 (01/14 1655) BP: (122-134)/(74-89) 125/74 (01/14 1655) SpO2:  [95 %-100 %] 100 % (01/14 1655) Weight:  [92.5 kg (204 lb)] 92.5 kg (204 lb) (01/14 0948)  Intake/Output from previous day: No intake/output data recorded. Intake/Output this shift: Total I/O In: 2800 [I.V.:2700; IV Piggyback:100] Out: 455 [Urine:100; Drains:5; Blood:350]  Physical Exam:  General: Alert and oriented. Abdomen: Soft, Nondistended. Incisions: Clean and dry. GU: Urine pink.  Lab Results: Recent Labs    05/02/17 1559  HGB 11.8*  HCT 35.9*    Assessment/Plan: POD#0   1) Continue to monitor, amublate, IS  Pryor Curia. MD   LOS: 0 days   Miguel Morris,LES 05/02/2017, 5:42 PM

## 2017-05-02 NOTE — Op Note (Signed)
Preoperative diagnosis: Clinically localized adenocarcinoma of the prostate (clinical stage T1c Nx Mx)  Postoperative diagnosis: Clinically localized adenocarcinoma of the prostate (clinical stage T1c Nx Mx)  Procedure:  1. Robotic assisted laparoscopic radical prostatectomy (bilateral nerve sparing) 2. Bilateral robotic assisted laparoscopic pelvic lymphadenectomy  Surgeon: Pryor Curia. M.D.  Assistant: Debbrah Alar, PA-C  An assistant was required for this surgical procedure.  The duties of the assistant included but were not limited to suctioning, passing suture, camera manipulation, retraction. This procedure would not be able to be performed without an Environmental consultant.  Resident: Dr. Jonna Clark  Anesthesia: General  Complications: None  EBL: 350 mL  IVF:  2500 mL crystalloid  Specimens: 1. Prostate and seminal vesicles 2. Right pelvic lymph nodes 3. Left pelvic lymph nodes  Disposition of specimens: Pathology  Drains: 1. 20 Fr coude catheter 2. # 19 Blake pelvic drain  Indication: Miguel Morris. is a 67 y.o. year old patient with clinically localized prostate cancer.  After a thorough review of the management options for treatment of prostate cancer, he elected to proceed with surgical therapy and the above procedure(s).  We have discussed the potential benefits and risks of the procedure, side effects of the proposed treatment, the likelihood of the patient achieving the goals of the procedure, and any potential problems that might occur during the procedure or recuperation. Informed consent has been obtained.  Description of procedure:  The patient was taken to the operating room and a general anesthetic was administered. He was given preoperative antibiotics, placed in the dorsal lithotomy position, and prepped and draped in the usual sterile fashion. Next a preoperative timeout was performed. A urethral catheter was placed into the bladder and a site was  selected near the umbilicus for placement of the camera port. This was placed using a standard open Hassan technique which allowed entry into the peritoneal cavity under direct vision and without difficulty. An 8 mm robotic port was placed and a pneumoperitoneum established. The camera was then used to inspect the abdomen and there was no evidence of any intra-abdominal injuries or other abnormalities. The remaining abdominal ports were then placed. 8 mm robotic ports were placed in the right lower quadrant, left lower quadrant, and far left lateral abdominal wall. A 5 mm port was placed in the right upper quadrant and a 12 mm port was placed in the right lateral abdominal wall for laparoscopic assistance. All ports were placed under direct vision without difficulty. The surgical cart was then docked.   Utilizing the cautery scissors, the bladder was reflected posteriorly allowing entry into the space of Retzius and identification of the endopelvic fascia and prostate. The periprostatic fat was then removed from the prostate allowing full exposure of the endopelvic fascia. The endopelvic fascia was then incised from the apex back to the base of the prostate bilaterally and the underlying levator muscle fibers were swept laterally off the prostate thereby isolating the dorsal venous complex. The dorsal vein was then stapled and divided with a 45 mm Flex Echelon stapler. Attention then turned to the bladder neck which was divided anteriorly thereby allowing entry into the bladder and exposure of the urethral catheter. The catheter balloon was deflated and the catheter was brought into the operative field and used to retract the prostate anteriorly. The posterior bladder neck was then examined and was divided allowing further dissection between the bladder and prostate posteriorly until the vasa deferentia and seminal vessels were identified. The vasa deferentia  were isolated, divided, and lifted anteriorly. The  seminal vesicles were dissected down to their tips with care to control the seminal vascular arterial blood supply. These structures were then lifted anteriorly and the space between Denonvillier's fascia and the anterior rectum was developed with a combination of sharp and blunt dissection. This isolated the vascular pedicles of the prostate.  The lateral prostatic fascia was then sharply incised allowing release of the neurovascular bundles bilaterally. The vascular pedicles of the prostate were then ligated with Weck clips between the prostate and neurovascular bundles and divided with sharp cold scissor dissection resulting in neurovascular bundle preservation. The neurovascular bundles were then separated off the apex of the prostate and urethra bilaterally.  The urethra was then sharply transected allowing the prostate specimen to be disarticulated. The pelvis was copiously irrigated and hemostasis was ensured. There was no evidence for rectal injury.  Attention then turned to the right pelvic sidewall. The fibrofatty tissue between the external iliac vein, confluence of the iliac vessels, hypogastric artery, and Cooper's ligament was dissected free from the pelvic sidewall with care to preserve the obturator nerve. Weck clips were used for lymphostasis and hemostasis. An identical procedure was performed on the contralateral side and the lymphatic packets were removed for permanent pathologic analysis.  Attention then turned to the urethral anastomosis. A 2-0 Vicryl slip knot was placed between Denonvillier's fascia, the posterior bladder neck, and the posterior urethra to reapproximate these structures. A double-armed 3-0 Monocryl suture was then used to perform a 360 running tension-free anastomosis between the bladder neck and urethra. A new urethral catheter was then placed into the bladder and irrigated. There were no blood clots within the bladder and the anastomosis appeared to be watertight.  A #19 Blake drain was then brought through the left lateral 8 mm port site and positioned appropriately within the pelvis. It was secured to the skin with a nylon suture. The surgical cart was then undocked. The right lateral 12 mm port site was closed at the fascial level with a 0 Vicryl suture placed laparoscopically. All remaining ports were then removed under direct vision. The prostate specimen was removed intact within the Endopouch retrieval bag via the periumbilical camera port site. This fascial opening was closed with two running 0 Vicryl sutures. 0.25% Marcaine was then injected into all port sites and all incisions were reapproximated at the skin level with 4-0 Monocryl subcuticular sutures and Liquiband. The patient appeared to tolerate the procedure well and without complications. The patient was able to be extubated and transferred to the recovery unit in satisfactory condition.   Pryor Curia MD

## 2017-05-02 NOTE — Anesthesia Preprocedure Evaluation (Addendum)
Anesthesia Evaluation  Patient identified by MRN, date of birth, ID band Patient awake  General Assessment Comment:History noted. CG  Reviewed: Allergy & Precautions, NPO status , Patient's Chart, lab work & pertinent test results  Airway Mallampati: II  TM Distance: >3 FB     Dental   Pulmonary former smoker,    breath sounds clear to auscultation       Cardiovascular hypertension,  Rhythm:Regular Rate:Normal     Neuro/Psych    GI/Hepatic Neg liver ROS, GERD  ,  Endo/Other  negative endocrine ROS  Renal/GU negative Renal ROS     Musculoskeletal  (+) Arthritis ,   Abdominal   Peds  Hematology  (+) anemia ,   Anesthesia Other Findings   Reproductive/Obstetrics                             Anesthesia Physical Anesthesia Plan  ASA: III  Anesthesia Plan: General   Post-op Pain Management:    Induction: Intravenous  PONV Risk Score and Plan: 2 and Treatment may vary due to age or medical condition, Ondansetron, Dexamethasone, Propofol infusion, Midazolam and Scopolamine patch - Pre-op  Airway Management Planned:   Additional Equipment:   Intra-op Plan:   Post-operative Plan: Possible Post-op intubation/ventilation  Informed Consent: I have reviewed the patients History and Physical, chart, labs and discussed the procedure including the risks, benefits and alternatives for the proposed anesthesia with the patient or authorized representative who has indicated his/her understanding and acceptance.   Dental advisory given  Plan Discussed with: CRNA and Anesthesiologist  Anesthesia Plan Comments:         Anesthesia Quick Evaluation

## 2017-05-02 NOTE — Transfer of Care (Signed)
Immediate Anesthesia Transfer of Care Note  Patient: Miguel Morris.  Procedure(s) Performed: XI ROBOTIC ASSISTED LAPAROSCOPIC RADICAL PROSTATECTOMY LEVEL 2 (N/A ) PELVIC/BILATERAL LYMPHADENECTOMY (Bilateral )  Patient Location: PACU  Anesthesia Type:General  Level of Consciousness: awake  Airway & Oxygen Therapy: Patient Spontanous Breathing and Patient connected to face mask oxygen  Post-op Assessment: Report given to RN and Post -op Vital signs reviewed and stable  Post vital signs: Reviewed and stable  Last Vitals:  Vitals:   05/02/17 0920  BP: 122/89  Pulse: 66  Resp: 18  Temp: 36.9 C  SpO2: 96%    Last Pain:  Vitals:   05/02/17 0920  TempSrc: Oral      Patients Stated Pain Goal: 4 (85/88/50 2774)  Complications: No apparent anesthesia complications

## 2017-05-02 NOTE — Anesthesia Procedure Notes (Signed)
Procedure Name: Intubation Date/Time: 05/02/2017 11:43 AM Performed by: Sharlette Dense, CRNA Patient Re-evaluated:Patient Re-evaluated prior to induction Oxygen Delivery Method: Circle system utilized Preoxygenation: Pre-oxygenation with 100% oxygen Induction Type: IV induction Ventilation: Mask ventilation without difficulty and Oral airway inserted - appropriate to patient size Laryngoscope Size: Sabra Heck and 2 Grade View: Grade II Tube type: Oral Tube size: 8.0 mm Number of attempts: 1 Airway Equipment and Method: Stylet Placement Confirmation: ETT inserted through vocal cords under direct vision,  CO2 detector and breath sounds checked- equal and bilateral Secured at: 22 cm Tube secured with: Tape Dental Injury: Injury to lip and Teeth and Oropharynx as per pre-operative assessment

## 2017-05-02 NOTE — Progress Notes (Signed)
Ambulated hall. O2 remained above 94% on RA. Tolerated well. Will cont to monitor

## 2017-05-02 NOTE — Plan of Care (Signed)
Cont to monitor

## 2017-05-02 NOTE — Discharge Instructions (Signed)

## 2017-05-03 ENCOUNTER — Encounter (HOSPITAL_COMMUNITY): Payer: Self-pay | Admitting: Urology

## 2017-05-03 DIAGNOSIS — I10 Essential (primary) hypertension: Secondary | ICD-10-CM | POA: Diagnosis not present

## 2017-05-03 DIAGNOSIS — Z87891 Personal history of nicotine dependence: Secondary | ICD-10-CM | POA: Diagnosis not present

## 2017-05-03 DIAGNOSIS — K219 Gastro-esophageal reflux disease without esophagitis: Secondary | ICD-10-CM | POA: Diagnosis not present

## 2017-05-03 DIAGNOSIS — C61 Malignant neoplasm of prostate: Secondary | ICD-10-CM | POA: Diagnosis not present

## 2017-05-03 DIAGNOSIS — D649 Anemia, unspecified: Secondary | ICD-10-CM | POA: Diagnosis not present

## 2017-05-03 DIAGNOSIS — Z79899 Other long term (current) drug therapy: Secondary | ICD-10-CM | POA: Diagnosis not present

## 2017-05-03 LAB — HEMOGLOBIN AND HEMATOCRIT, BLOOD
HCT: 31.9 % — ABNORMAL LOW (ref 39.0–52.0)
Hemoglobin: 10.5 g/dL — ABNORMAL LOW (ref 13.0–17.0)

## 2017-05-03 MED ORDER — BISACODYL 10 MG RE SUPP
10.0000 mg | Freq: Once | RECTAL | Status: AC
  Administered 2017-05-03: 10 mg via RECTAL
  Filled 2017-05-03: qty 1

## 2017-05-03 MED ORDER — CALCIUM CARBONATE ANTACID 500 MG PO CHEW
2.0000 | CHEWABLE_TABLET | Freq: Once | ORAL | Status: AC
  Administered 2017-05-03: 400 mg via ORAL
  Filled 2017-05-03: qty 2

## 2017-05-03 MED ORDER — HYDROCODONE-ACETAMINOPHEN 5-325 MG PO TABS: 1.0000 | ORAL_TABLET | Freq: Four times a day (QID) | ORAL | Status: DC | PRN

## 2017-05-03 NOTE — Discharge Summary (Signed)
  Date of admission: 05/02/2017  Date of discharge: 05/03/2017  Admission diagnosis: Prostate Cancer  Discharge diagnosis: Prostate Cancer  History and Physical: For full details, please see admission history and physical. Briefly, Miguel Partch. is a 67 y.o. gentleman with localized prostate cancer.  After discussing management/treatment options, he elected to proceed with surgical treatment.  Hospital Course: Miguel Morris. was taken to the operating room on 05/02/2017 and underwent a robotic assisted laparoscopic radical prostatectomy. He tolerated this procedure well and without complications. Postoperatively, he was able to be transferred to a regular hospital room following recovery from anesthesia.  He was able to begin ambulating the night of surgery. He remained hemodynamically stable overnight.  He had excellent urine output with appropriately minimal output from his pelvic drain and his pelvic drain was removed on POD #1.  He was transitioned to oral pain medication, tolerated a clear liquid diet, and had met all discharge criteria and was able to be discharged home later on POD#1.  Laboratory values:  Recent Labs    05/02/17 1559 05/03/17 0523  HGB 11.8* 10.5*  HCT 35.9* 31.9*    Disposition: Home  Discharge instruction: He was instructed to be ambulatory but to refrain from heavy lifting, strenuous activity, or driving. He was instructed on urethral catheter care.  Discharge medications:   Allergies as of 05/03/2017   No Known Allergies     Medication List    STOP taking these medications   vitamin C 500 MG tablet Commonly known as:  ASCORBIC ACID     TAKE these medications   amLODipine 10 MG tablet Commonly known as:  NORVASC Take 10 mg by mouth daily.   hydrochlorothiazide 25 MG tablet Commonly known as:  HYDRODIURIL Take 25 mg daily by mouth.   HYDROcodone-acetaminophen 5-325 MG tablet Commonly known as:  NORCO Take 1-2 tablets by mouth every 6  (six) hours as needed for moderate pain or severe pain.   losartan 100 MG tablet Commonly known as:  COZAAR Take 100 mg daily by mouth.   potassium chloride 10 MEQ tablet Commonly known as:  K-DUR Take 1 tablet (10 mEq total) by mouth daily.   pramipexole 0.25 MG tablet Commonly known as:  MIRAPEX Take 0.5 mg by mouth at bedtime.   sulfamethoxazole-trimethoprim 800-160 MG tablet Commonly known as:  BACTRIM DS,SEPTRA DS Take 1 tablet by mouth 2 (two) times daily. Start the day prior to foley removal appointment   TUMS PO Take 1-2 tablets by mouth at bedtime as needed (for hearburn/indigestion).       Followup: He will followup in 1 week for catheter removal and to discuss his surgical pathology results.

## 2017-05-03 NOTE — Care Management Note (Signed)
Case Management Note  Patient Details  Name: Miguel Morris. MRN: [id number] Date of Birth: June 20, 1950  Subjective/Objective:                    Action/Plan:   Expected Discharge Date:  05/03/17               Expected Discharge Plan:  Home/Self Care  In-House Referral:     Discharge planning Services  CM Consult  Post Acute Care Choice:    Choice offered to:     DME Arranged:    DME Agency:     HH Arranged:    HH Agency:     Status of Service:  Completed, signed off  If discussed at [employer] of Stay Meetings, dates discussed:    Additional CommentsPurcell Mouton, RN 05/03/2017, 12:21 PM

## 2017-05-03 NOTE — Progress Notes (Signed)
Patient ID: Miguel Nettles., male   DOB: 1951-04-19, 67 y.o.   MRN: [id number]  1 Day Post-Op Subjective: The patient is doing well.  No nausea or vomiting. Pain is adequately controlled.  Objective: Vital signs in last 24 hours: Temp:  [98.3 F (36.8 C)-99.1 F (37.3 C)] 98.4 F (36.9 C) (01/15 0555) Pulse Rate:  [51-77] 60 (01/15 0555) Resp:  [15-18] 16 (01/15 0555) BP: (112-134)/(64-89) 112/76 (01/15 0555) SpO2:  [94 %-100 %] 99 % (01/15 0555) Weight:  [92.5 kg (204 lb)] 92.5 kg (204 lb) (01/14 0948)  Intake/Output from previous day: 01/14 0701 - 01/15 0700 In: 4645 [I.V.:4445; IV Piggyback:200] Out: 1915 [Urine:1500; Drains:65; Blood:350] Intake/Output this shift: No intake/output data recorded.  Physical Exam:  General: Alert and oriented. CV: RRR Lungs: Clear bilaterally. GI: Soft, Nondistended. Incisions: Clean, dry, and intact Urine: Clear Extremities: Nontender, no erythema, no edema.  Lab Results: Recent Labs    05/02/17 1559 05/03/17 0523  HGB 11.8* 10.5*  HCT 35.9* 31.9*      Assessment/Plan: POD# 1 s/p robotic prostatectomy.  1) SL IVF 2) Ambulate, Incentive spirometry 3) Transition to oral pain medication 4) Dulcolax suppository 5) D/C pelvic drain 6) Plan for likely discharge later today   Miguel Morris. MD   LOS: 0 days   Miguel Morris,LES 05/03/2017, 7:18 AM

## 2017-05-03 NOTE — Care Management Obs Status (Signed)
[place] NOTIFICATION   Patient Details  Name: Miguel Morris. MRN: [id number] Date of Birth: [birth year]   Medicare Observation Status Notification Given:  Yes    Purcell Mouton, RN 05/03/2017, 12:15 PM

## 2017-05-17 DIAGNOSIS — H52222 Regular astigmatism, left eye: Secondary | ICD-10-CM | POA: Diagnosis not present

## 2017-05-17 DIAGNOSIS — H5212 Myopia, left eye: Secondary | ICD-10-CM | POA: Diagnosis not present

## 2017-05-17 DIAGNOSIS — H25813 Combined forms of age-related cataract, bilateral: Secondary | ICD-10-CM | POA: Diagnosis not present

## 2017-05-17 DIAGNOSIS — H524 Presbyopia: Secondary | ICD-10-CM | POA: Diagnosis not present

## 2017-05-17 DIAGNOSIS — H52221 Regular astigmatism, right eye: Secondary | ICD-10-CM | POA: Diagnosis not present

## 2017-05-19 DIAGNOSIS — Z8739 Personal history of other diseases of the musculoskeletal system and connective tissue: Secondary | ICD-10-CM | POA: Diagnosis not present

## 2017-05-19 DIAGNOSIS — I10 Essential (primary) hypertension: Secondary | ICD-10-CM | POA: Diagnosis not present

## 2017-05-25 DIAGNOSIS — G2581 Restless legs syndrome: Secondary | ICD-10-CM | POA: Diagnosis not present

## 2017-05-25 DIAGNOSIS — D649 Anemia, unspecified: Secondary | ICD-10-CM | POA: Diagnosis not present

## 2017-05-25 DIAGNOSIS — R7301 Impaired fasting glucose: Secondary | ICD-10-CM | POA: Diagnosis not present

## 2017-05-25 DIAGNOSIS — Z23 Encounter for immunization: Secondary | ICD-10-CM | POA: Diagnosis not present

## 2017-05-25 DIAGNOSIS — Z8739 Personal history of other diseases of the musculoskeletal system and connective tissue: Secondary | ICD-10-CM | POA: Diagnosis not present

## 2017-05-25 DIAGNOSIS — Z8546 Personal history of malignant neoplasm of prostate: Secondary | ICD-10-CM | POA: Diagnosis not present

## 2017-05-25 DIAGNOSIS — E663 Overweight: Secondary | ICD-10-CM | POA: Diagnosis not present

## 2017-05-25 DIAGNOSIS — I10 Essential (primary) hypertension: Secondary | ICD-10-CM | POA: Diagnosis not present

## 2017-05-25 DIAGNOSIS — Z0000 Encounter for general adult medical examination without abnormal findings: Secondary | ICD-10-CM | POA: Diagnosis not present

## 2017-05-30 DIAGNOSIS — N393 Stress incontinence (female) (male): Secondary | ICD-10-CM | POA: Diagnosis not present

## 2017-05-30 DIAGNOSIS — M6289 Other specified disorders of muscle: Secondary | ICD-10-CM | POA: Diagnosis not present

## 2017-05-30 DIAGNOSIS — M6281 Muscle weakness (generalized): Secondary | ICD-10-CM | POA: Diagnosis not present

## 2017-05-30 DIAGNOSIS — R350 Frequency of micturition: Secondary | ICD-10-CM | POA: Diagnosis not present

## 2017-06-13 DIAGNOSIS — N393 Stress incontinence (female) (male): Secondary | ICD-10-CM | POA: Diagnosis not present

## 2017-06-13 DIAGNOSIS — R350 Frequency of micturition: Secondary | ICD-10-CM | POA: Diagnosis not present

## 2017-06-13 DIAGNOSIS — M6289 Other specified disorders of muscle: Secondary | ICD-10-CM | POA: Diagnosis not present

## 2017-06-13 DIAGNOSIS — M6281 Muscle weakness (generalized): Secondary | ICD-10-CM | POA: Diagnosis not present

## 2017-07-28 DIAGNOSIS — G2581 Restless legs syndrome: Secondary | ICD-10-CM | POA: Diagnosis not present

## 2017-07-28 DIAGNOSIS — I10 Essential (primary) hypertension: Secondary | ICD-10-CM | POA: Diagnosis not present

## 2017-07-28 DIAGNOSIS — Z8546 Personal history of malignant neoplasm of prostate: Secondary | ICD-10-CM | POA: Diagnosis not present

## 2017-07-28 DIAGNOSIS — R7301 Impaired fasting glucose: Secondary | ICD-10-CM | POA: Diagnosis not present

## 2017-07-28 DIAGNOSIS — D649 Anemia, unspecified: Secondary | ICD-10-CM | POA: Diagnosis not present

## 2017-07-28 DIAGNOSIS — Z8739 Personal history of other diseases of the musculoskeletal system and connective tissue: Secondary | ICD-10-CM | POA: Diagnosis not present

## 2017-07-28 DIAGNOSIS — Z0000 Encounter for general adult medical examination without abnormal findings: Secondary | ICD-10-CM | POA: Diagnosis not present

## 2017-08-04 DIAGNOSIS — C61 Malignant neoplasm of prostate: Secondary | ICD-10-CM | POA: Diagnosis not present

## 2017-08-15 DIAGNOSIS — C61 Malignant neoplasm of prostate: Secondary | ICD-10-CM | POA: Diagnosis not present

## 2017-08-15 DIAGNOSIS — N393 Stress incontinence (female) (male): Secondary | ICD-10-CM | POA: Diagnosis not present

## 2017-08-15 DIAGNOSIS — N5201 Erectile dysfunction due to arterial insufficiency: Secondary | ICD-10-CM | POA: Diagnosis not present

## 2017-11-21 DIAGNOSIS — B360 Pityriasis versicolor: Secondary | ICD-10-CM | POA: Diagnosis not present

## 2017-11-21 DIAGNOSIS — D485 Neoplasm of uncertain behavior of skin: Secondary | ICD-10-CM | POA: Diagnosis not present

## 2017-11-21 DIAGNOSIS — D230 Other benign neoplasm of skin of lip: Secondary | ICD-10-CM | POA: Diagnosis not present

## 2017-11-25 DIAGNOSIS — G2581 Restless legs syndrome: Secondary | ICD-10-CM | POA: Diagnosis not present

## 2017-11-25 DIAGNOSIS — Z8739 Personal history of other diseases of the musculoskeletal system and connective tissue: Secondary | ICD-10-CM | POA: Diagnosis not present

## 2017-11-25 DIAGNOSIS — Z8546 Personal history of malignant neoplasm of prostate: Secondary | ICD-10-CM | POA: Diagnosis not present

## 2017-11-25 DIAGNOSIS — R7301 Impaired fasting glucose: Secondary | ICD-10-CM | POA: Diagnosis not present

## 2017-11-25 DIAGNOSIS — Z0000 Encounter for general adult medical examination without abnormal findings: Secondary | ICD-10-CM | POA: Diagnosis not present

## 2017-11-25 DIAGNOSIS — I10 Essential (primary) hypertension: Secondary | ICD-10-CM | POA: Diagnosis not present

## 2017-11-25 DIAGNOSIS — D649 Anemia, unspecified: Secondary | ICD-10-CM | POA: Diagnosis not present

## 2017-11-30 DIAGNOSIS — F419 Anxiety disorder, unspecified: Secondary | ICD-10-CM | POA: Diagnosis not present

## 2017-12-08 DIAGNOSIS — F419 Anxiety disorder, unspecified: Secondary | ICD-10-CM | POA: Diagnosis not present

## 2017-12-16 ENCOUNTER — Encounter: Payer: Self-pay | Admitting: Cardiovascular Disease

## 2017-12-16 ENCOUNTER — Ambulatory Visit (INDEPENDENT_AMBULATORY_CARE_PROVIDER_SITE_OTHER): Payer: Medicare Other | Admitting: Cardiovascular Disease

## 2017-12-16 VITALS — BP 122/80 | HR 50 | Ht 72.0 in | Wt 205.4 lb

## 2017-12-16 DIAGNOSIS — I10 Essential (primary) hypertension: Secondary | ICD-10-CM | POA: Diagnosis not present
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
## 2017-12-16 NOTE — Patient Instructions (Signed)
Medication Instructions:  Your physician recommends that you continue on your current medications as directed. Please refer to the Current Medication list given to you today.   Labwork: None Ordered   Testing/Procedures: None Ordered   Follow-Up: Your physician recommends that you schedule a follow-up appointment in: as needed with Dr. Nahser   If you need a refill on your cardiac medications before your next appointment, please call your pharmacy.   Thank you for choosing [facility]! Dessie Delcarlo, RN [phone]    

## 2017-12-21 DIAGNOSIS — F419 Anxiety disorder, unspecified: Secondary | ICD-10-CM | POA: Diagnosis not present

## 2017-12-27 DIAGNOSIS — F419 Anxiety disorder, unspecified: Secondary | ICD-10-CM | POA: Diagnosis not present

## 2018-01-04 DIAGNOSIS — F419 Anxiety disorder, unspecified: Secondary | ICD-10-CM | POA: Diagnosis not present

## 2018-01-11 DIAGNOSIS — F419 Anxiety disorder, unspecified: Secondary | ICD-10-CM | POA: Diagnosis not present

## 2018-01-25 DIAGNOSIS — F419 Anxiety disorder, unspecified: Secondary | ICD-10-CM | POA: Diagnosis not present

## 2018-01-30 DIAGNOSIS — B349 Viral infection, unspecified: Secondary | ICD-10-CM | POA: Diagnosis not present

## 2018-01-30 DIAGNOSIS — R509 Fever, unspecified: Secondary | ICD-10-CM | POA: Diagnosis not present

## 2018-02-14 DIAGNOSIS — M6281 Muscle weakness (generalized): Secondary | ICD-10-CM | POA: Diagnosis not present

## 2018-02-14 DIAGNOSIS — N393 Stress incontinence (female) (male): Secondary | ICD-10-CM | POA: Diagnosis not present

## 2018-02-15 DIAGNOSIS — F419 Anxiety disorder, unspecified: Secondary | ICD-10-CM | POA: Diagnosis not present

## 2018-02-24 DIAGNOSIS — F419 Anxiety disorder, unspecified: Secondary | ICD-10-CM | POA: Diagnosis not present

## 2018-02-27 DIAGNOSIS — C61 Malignant neoplasm of prostate: Secondary | ICD-10-CM | POA: Diagnosis not present

## 2018-03-01 DIAGNOSIS — F419 Anxiety disorder, unspecified: Secondary | ICD-10-CM | POA: Diagnosis not present
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
## 2018-03-08 DIAGNOSIS — Z23 Encounter for immunization: Secondary | ICD-10-CM | POA: Diagnosis not present

## 2018-03-08 DIAGNOSIS — F419 Anxiety disorder, unspecified: Secondary | ICD-10-CM | POA: Diagnosis not present

## 2018-03-10 DIAGNOSIS — N393 Stress incontinence (female) (male): Secondary | ICD-10-CM | POA: Diagnosis not present

## 2018-03-10 DIAGNOSIS — C61 Malignant neoplasm of prostate: Secondary | ICD-10-CM | POA: Diagnosis not present

## 2018-03-10 DIAGNOSIS — N5201 Erectile dysfunction due to arterial insufficiency: Secondary | ICD-10-CM | POA: Diagnosis not present

## 2018-03-22 DIAGNOSIS — F419 Anxiety disorder, unspecified: Secondary | ICD-10-CM | POA: Diagnosis not present

## 2018-03-27 DIAGNOSIS — M6281 Muscle weakness (generalized): Secondary | ICD-10-CM | POA: Diagnosis not present

## 2018-03-27 DIAGNOSIS — N393 Stress incontinence (female) (male): Secondary | ICD-10-CM | POA: Diagnosis not present

## 2018-03-29 DIAGNOSIS — F419 Anxiety disorder, unspecified: Secondary | ICD-10-CM | POA: Diagnosis not present

## 2018-04-05 DIAGNOSIS — F419 Anxiety disorder, unspecified: Secondary | ICD-10-CM | POA: Diagnosis not present

## 2018-04-26 DIAGNOSIS — F419 Anxiety disorder, unspecified: Secondary | ICD-10-CM | POA: Diagnosis not present

## 2018-05-10 DIAGNOSIS — F419 Anxiety disorder, unspecified: Secondary | ICD-10-CM | POA: Diagnosis not present

## 2018-05-17 DIAGNOSIS — F419 Anxiety disorder, unspecified: Secondary | ICD-10-CM | POA: Diagnosis not present

## 2018-05-17 DIAGNOSIS — H6121 Impacted cerumen, right ear: Secondary | ICD-10-CM | POA: Diagnosis not present

## 2018-05-24 DIAGNOSIS — F419 Anxiety disorder, unspecified: Secondary | ICD-10-CM | POA: Diagnosis not present

## 2018-06-07 DIAGNOSIS — F419 Anxiety disorder, unspecified: Secondary | ICD-10-CM | POA: Diagnosis not present

## 2018-06-14 DIAGNOSIS — F419 Anxiety disorder, unspecified: Secondary | ICD-10-CM | POA: Diagnosis not present

## 2018-06-21 DIAGNOSIS — F419 Anxiety disorder, unspecified: Secondary | ICD-10-CM | POA: Diagnosis not present

## 2018-06-21 DIAGNOSIS — H25813 Combined forms of age-related cataract, bilateral: Secondary | ICD-10-CM | POA: Diagnosis not present

## 2018-06-21 DIAGNOSIS — H17821 Peripheral opacity of cornea, right eye: Secondary | ICD-10-CM | POA: Diagnosis not present

## 2018-06-21 DIAGNOSIS — H10503 Unspecified blepharoconjunctivitis, bilateral: Secondary | ICD-10-CM | POA: Diagnosis not present

## 2018-06-21 DIAGNOSIS — H524 Presbyopia: Secondary | ICD-10-CM | POA: Diagnosis not present

## 2018-06-26 DIAGNOSIS — Z8739 Personal history of other diseases of the musculoskeletal system and connective tissue: Secondary | ICD-10-CM | POA: Diagnosis not present

## 2018-06-26 DIAGNOSIS — Z125 Encounter for screening for malignant neoplasm of prostate: Secondary | ICD-10-CM | POA: Diagnosis not present

## 2018-06-26 DIAGNOSIS — I10 Essential (primary) hypertension: Secondary | ICD-10-CM | POA: Diagnosis not present

## 2018-06-28 DIAGNOSIS — F419 Anxiety disorder, unspecified: Secondary | ICD-10-CM | POA: Diagnosis not present

## 2018-06-30 DIAGNOSIS — H9190 Unspecified hearing loss, unspecified ear: Secondary | ICD-10-CM | POA: Diagnosis not present

## 2018-06-30 DIAGNOSIS — R7301 Impaired fasting glucose: Secondary | ICD-10-CM | POA: Diagnosis not present

## 2018-06-30 DIAGNOSIS — Z0000 Encounter for general adult medical examination without abnormal findings: Secondary | ICD-10-CM | POA: Diagnosis not present

## 2018-06-30 DIAGNOSIS — N529 Male erectile dysfunction, unspecified: Secondary | ICD-10-CM | POA: Diagnosis not present

## 2018-06-30 DIAGNOSIS — I10 Essential (primary) hypertension: Secondary | ICD-10-CM | POA: Diagnosis not present

## 2018-06-30 DIAGNOSIS — Z8546 Personal history of malignant neoplasm of prostate: Secondary | ICD-10-CM | POA: Diagnosis not present

## 2018-06-30 DIAGNOSIS — Z8739 Personal history of other diseases of the musculoskeletal system and connective tissue: Secondary | ICD-10-CM | POA: Diagnosis not present

## 2018-07-12 DIAGNOSIS — F419 Anxiety disorder, unspecified: Secondary | ICD-10-CM | POA: Diagnosis not present

## 2018-07-13 DIAGNOSIS — H9313 Tinnitus, bilateral: Secondary | ICD-10-CM | POA: Diagnosis not present

## 2018-07-13 DIAGNOSIS — H903 Sensorineural hearing loss, bilateral: Secondary | ICD-10-CM | POA: Diagnosis not present

## 2018-07-13 DIAGNOSIS — Z77122 Contact with and (suspected) exposure to noise: Secondary | ICD-10-CM | POA: Diagnosis not present

## 2018-08-31 DIAGNOSIS — Z03818 Encounter for observation for suspected exposure to other biological agents ruled out: Secondary | ICD-10-CM | POA: Diagnosis not present

## 2018-09-04 DIAGNOSIS — C61 Malignant neoplasm of prostate: Secondary | ICD-10-CM | POA: Diagnosis not present

## 2018-09-07 DIAGNOSIS — N5201 Erectile dysfunction due to arterial insufficiency: Secondary | ICD-10-CM | POA: Diagnosis not present

## 2018-09-07 DIAGNOSIS — N393 Stress incontinence (female) (male): Secondary | ICD-10-CM | POA: Diagnosis not present

## 2018-09-07 DIAGNOSIS — C61 Malignant neoplasm of prostate: Secondary | ICD-10-CM | POA: Diagnosis not present

## 2019-02-20 DIAGNOSIS — Z23 Encounter for immunization: Secondary | ICD-10-CM | POA: Diagnosis not present

## 2019-02-20 DIAGNOSIS — S61213A Laceration without foreign body of left middle finger without damage to nail, initial encounter: Secondary | ICD-10-CM | POA: Diagnosis not present

## 2019-03-13 DIAGNOSIS — Z9079 Acquired absence of other genital organ(s): Secondary | ICD-10-CM | POA: Diagnosis not present

## 2019-03-13 DIAGNOSIS — I10 Essential (primary) hypertension: Secondary | ICD-10-CM | POA: Diagnosis not present

## 2019-03-13 DIAGNOSIS — Z8546 Personal history of malignant neoplasm of prostate: Secondary | ICD-10-CM | POA: Diagnosis not present

## 2019-03-13 DIAGNOSIS — N5234 Erectile dysfunction following simple prostatectomy: Secondary | ICD-10-CM | POA: Diagnosis not present

## 2019-04-25 DIAGNOSIS — F5221 Male erectile disorder: Secondary | ICD-10-CM | POA: Diagnosis not present

## 2019-07-12 DIAGNOSIS — Z0000 Encounter for general adult medical examination without abnormal findings: Secondary | ICD-10-CM | POA: Diagnosis not present

## 2019-07-12 DIAGNOSIS — N5234 Erectile dysfunction following simple prostatectomy: Secondary | ICD-10-CM | POA: Diagnosis not present

## 2019-07-12 DIAGNOSIS — H9193 Unspecified hearing loss, bilateral: Secondary | ICD-10-CM | POA: Diagnosis not present

## 2019-07-12 DIAGNOSIS — Z13220 Encounter for screening for lipoid disorders: Secondary | ICD-10-CM | POA: Diagnosis not present

## 2019-07-12 DIAGNOSIS — B369 Superficial mycosis, unspecified: Secondary | ICD-10-CM | POA: Diagnosis not present

## 2019-07-12 DIAGNOSIS — Z9079 Acquired absence of other genital organ(s): Secondary | ICD-10-CM | POA: Diagnosis not present

## 2019-07-12 DIAGNOSIS — Z130 Encounter for screening for diseases of the blood and blood-forming organs and certain disorders involving the immune mechanism: Secondary | ICD-10-CM | POA: Diagnosis not present

## 2019-07-12 DIAGNOSIS — Z862 Personal history of diseases of the blood and blood-forming organs and certain disorders involving the immune mechanism: Secondary | ICD-10-CM | POA: Diagnosis not present

## 2019-07-12 DIAGNOSIS — Z8546 Personal history of malignant neoplasm of prostate: Secondary | ICD-10-CM | POA: Diagnosis not present

## 2019-07-12 DIAGNOSIS — E876 Hypokalemia: Secondary | ICD-10-CM | POA: Diagnosis not present

## 2019-07-12 DIAGNOSIS — Z87891 Personal history of nicotine dependence: Secondary | ICD-10-CM | POA: Diagnosis not present

## 2019-07-12 DIAGNOSIS — I10 Essential (primary) hypertension: Secondary | ICD-10-CM | POA: Diagnosis not present

## 2019-08-16 DIAGNOSIS — H2513 Age-related nuclear cataract, bilateral: Secondary | ICD-10-CM | POA: Diagnosis not present

## 2019-08-16 DIAGNOSIS — H35363 Drusen (degenerative) of macula, bilateral: Secondary | ICD-10-CM | POA: Diagnosis not present

## 2019-09-03 DIAGNOSIS — H2513 Age-related nuclear cataract, bilateral: Secondary | ICD-10-CM | POA: Diagnosis not present

## 2019-09-03 DIAGNOSIS — H25013 Cortical age-related cataract, bilateral: Secondary | ICD-10-CM | POA: Diagnosis not present

## 2019-09-03 DIAGNOSIS — H35363 Drusen (degenerative) of macula, bilateral: Secondary | ICD-10-CM | POA: Diagnosis not present

## 2019-09-03 DIAGNOSIS — H52213 Irregular astigmatism, bilateral: Secondary | ICD-10-CM | POA: Diagnosis not present

## 2019-10-19 ENCOUNTER — Encounter: Payer: Self-pay | Admitting: Radiology

## 2019-10-19 ENCOUNTER — Telehealth: Payer: Self-pay | Admitting: Orthopaedic Surgery

## 2019-10-19 NOTE — Telephone Encounter (Signed)
faxed

## 2019-10-19 NOTE — Telephone Encounter (Signed)
Marie with [facility] called needing a pre med protocol for the pt that just moved out there.  Maries Fax# [phone]

## 2019-11-04 DIAGNOSIS — B338 Other specified viral diseases: Secondary | ICD-10-CM | POA: Diagnosis not present

## 2019-11-04 DIAGNOSIS — Z7182 Exercise counseling: Secondary | ICD-10-CM | POA: Diagnosis not present

## 2019-11-04 DIAGNOSIS — R5081 Fever presenting with conditions classified elsewhere: Secondary | ICD-10-CM | POA: Diagnosis not present

## 2019-11-04 DIAGNOSIS — Z20822 Contact with and (suspected) exposure to covid-19: Secondary | ICD-10-CM | POA: Diagnosis not present

## 2020-01-11 DIAGNOSIS — N5234 Erectile dysfunction following simple prostatectomy: Secondary | ICD-10-CM | POA: Diagnosis not present

## 2020-01-11 DIAGNOSIS — E876 Hypokalemia: Secondary | ICD-10-CM | POA: Diagnosis not present

## 2020-01-11 DIAGNOSIS — Z23 Encounter for immunization: Secondary | ICD-10-CM | POA: Diagnosis not present

## 2020-01-11 DIAGNOSIS — Z8546 Personal history of malignant neoplasm of prostate: Secondary | ICD-10-CM | POA: Diagnosis not present

## 2020-01-11 DIAGNOSIS — Z9079 Acquired absence of other genital organ(s): Secondary | ICD-10-CM | POA: Diagnosis not present

## 2020-01-11 DIAGNOSIS — I10 Essential (primary) hypertension: Secondary | ICD-10-CM | POA: Diagnosis not present
